# Patient Record
Sex: Male | Born: 1945 | Race: White | Hispanic: No | Marital: Married | Smoking: Current every day smoker
Health system: Southern US, Community
[De-identification: ages and names within clinical notes are randomized; demographics above are authoritative.]

## PROBLEM LIST (undated history)

## (undated) DIAGNOSIS — I10 Essential (primary) hypertension: Secondary | ICD-10-CM

## (undated) DIAGNOSIS — I5020 Unspecified systolic (congestive) heart failure: Secondary | ICD-10-CM

## (undated) DIAGNOSIS — R55 Syncope and collapse: Secondary | ICD-10-CM

## (undated) DIAGNOSIS — I480 Paroxysmal atrial fibrillation: Secondary | ICD-10-CM

## (undated) DIAGNOSIS — E785 Hyperlipidemia, unspecified: Secondary | ICD-10-CM

## (undated) DIAGNOSIS — I214 Non-ST elevation (NSTEMI) myocardial infarction: Secondary | ICD-10-CM

## (undated) DIAGNOSIS — Z720 Tobacco use: Secondary | ICD-10-CM

---

## 2019-07-12 ENCOUNTER — Other Ambulatory Visit: Payer: Self-pay

## 2019-07-12 ENCOUNTER — Encounter: Payer: Self-pay | Admitting: Specialist

## 2019-07-12 ENCOUNTER — Inpatient Hospital Stay
Admission: EM | Admit: 2019-07-12 | Discharge: 2019-07-13 | DRG: 281 | Disposition: A | Discharge status: Home / Self Care | Payer: PPO | Attending: Specialist | Admitting: Specialist

## 2019-07-12 ENCOUNTER — Emergency Department: Payer: PPO

## 2019-07-12 DIAGNOSIS — F0390 Unspecified dementia without behavioral disturbance: Secondary | ICD-10-CM | POA: Diagnosis not present

## 2019-07-12 DIAGNOSIS — Z9119 Patient's noncompliance with other medical treatment and regimen: Secondary | ICD-10-CM

## 2019-07-12 DIAGNOSIS — E860 Dehydration: Secondary | ICD-10-CM | POA: Diagnosis present

## 2019-07-12 DIAGNOSIS — F17210 Nicotine dependence, cigarettes, uncomplicated: Secondary | ICD-10-CM | POA: Diagnosis not present

## 2019-07-12 DIAGNOSIS — I429 Cardiomyopathy, unspecified: Secondary | ICD-10-CM | POA: Diagnosis not present

## 2019-07-12 DIAGNOSIS — I4891 Unspecified atrial fibrillation: Secondary | ICD-10-CM

## 2019-07-12 DIAGNOSIS — R000 Tachycardia, unspecified: Secondary | ICD-10-CM | POA: Diagnosis not present

## 2019-07-12 DIAGNOSIS — I5022 Chronic systolic (congestive) heart failure: Secondary | ICD-10-CM | POA: Diagnosis present

## 2019-07-12 DIAGNOSIS — N179 Acute kidney failure, unspecified: Secondary | ICD-10-CM | POA: Diagnosis not present

## 2019-07-12 DIAGNOSIS — Z809 Family history of malignant neoplasm, unspecified: Secondary | ICD-10-CM | POA: Diagnosis not present

## 2019-07-12 DIAGNOSIS — G9340 Encephalopathy, unspecified: Secondary | ICD-10-CM | POA: Diagnosis present

## 2019-07-12 DIAGNOSIS — I4892 Unspecified atrial flutter: Secondary | ICD-10-CM | POA: Diagnosis not present

## 2019-07-12 DIAGNOSIS — R001 Bradycardia, unspecified: Secondary | ICD-10-CM | POA: Diagnosis not present

## 2019-07-12 DIAGNOSIS — I214 Non-ST elevation (NSTEMI) myocardial infarction: Principal | ICD-10-CM | POA: Diagnosis present

## 2019-07-12 DIAGNOSIS — Z823 Family history of stroke: Secondary | ICD-10-CM

## 2019-07-12 DIAGNOSIS — Z66 Do not resuscitate: Secondary | ICD-10-CM | POA: Diagnosis present

## 2019-07-12 DIAGNOSIS — I480 Paroxysmal atrial fibrillation: Secondary | ICD-10-CM | POA: Diagnosis not present

## 2019-07-12 DIAGNOSIS — I340 Nonrheumatic mitral (valve) insufficiency: Secondary | ICD-10-CM | POA: Diagnosis not present

## 2019-07-12 DIAGNOSIS — E785 Hyperlipidemia, unspecified: Secondary | ICD-10-CM | POA: Diagnosis present

## 2019-07-12 DIAGNOSIS — R404 Transient alteration of awareness: Secondary | ICD-10-CM | POA: Diagnosis not present

## 2019-07-12 DIAGNOSIS — R410 Disorientation, unspecified: Secondary | ICD-10-CM | POA: Diagnosis not present

## 2019-07-12 DIAGNOSIS — R55 Syncope and collapse: Secondary | ICD-10-CM | POA: Diagnosis not present

## 2019-07-12 DIAGNOSIS — Z03818 Encounter for observation for suspected exposure to other biological agents ruled out: Secondary | ICD-10-CM | POA: Diagnosis not present

## 2019-07-12 DIAGNOSIS — I130 Hypertensive heart and chronic kidney disease with heart failure and stage 1 through stage 4 chronic kidney disease, or unspecified chronic kidney disease: Secondary | ICD-10-CM | POA: Diagnosis present

## 2019-07-12 DIAGNOSIS — R4182 Altered mental status, unspecified: Secondary | ICD-10-CM | POA: Diagnosis not present

## 2019-07-12 DIAGNOSIS — I255 Ischemic cardiomyopathy: Secondary | ICD-10-CM | POA: Diagnosis not present

## 2019-07-12 DIAGNOSIS — N189 Chronic kidney disease, unspecified: Secondary | ICD-10-CM | POA: Diagnosis present

## 2019-07-12 DIAGNOSIS — E1122 Type 2 diabetes mellitus with diabetic chronic kidney disease: Secondary | ICD-10-CM | POA: Diagnosis present

## 2019-07-12 DIAGNOSIS — Z20828 Contact with and (suspected) exposure to other viral communicable diseases: Secondary | ICD-10-CM | POA: Diagnosis present

## 2019-07-12 DIAGNOSIS — I361 Nonrheumatic tricuspid (valve) insufficiency: Secondary | ICD-10-CM | POA: Diagnosis not present

## 2019-07-12 HISTORY — DX: Syncope and collapse: R55

## 2019-07-12 HISTORY — DX: Essential (primary) hypertension: I10

## 2019-07-12 HISTORY — DX: Tobacco use: Z72.0

## 2019-07-12 HISTORY — DX: Unspecified systolic (congestive) heart failure: I50.20

## 2019-07-12 HISTORY — DX: Paroxysmal atrial fibrillation: I48.0

## 2019-07-12 HISTORY — DX: Non-ST elevation (NSTEMI) myocardial infarction: I21.4

## 2019-07-12 HISTORY — DX: Hyperlipidemia, unspecified: E78.5

## 2019-07-12 LAB — URINE DRUG SCREEN, QUALITATIVE (ARMC ONLY)
Amphetamines, Ur Screen: NOT DETECTED
Barbiturates, Ur Screen: NOT DETECTED
Benzodiazepine, Ur Scrn: NOT DETECTED
Cannabinoid 50 Ng, Ur ~~LOC~~: NOT DETECTED
Cocaine Metabolite,Ur ~~LOC~~: NOT DETECTED
MDMA (Ecstasy)Ur Screen: NOT DETECTED
Methadone Scn, Ur: NOT DETECTED
Opiate, Ur Screen: NOT DETECTED
Phencyclidine (PCP) Ur S: NOT DETECTED
Tricyclic, Ur Screen: NOT DETECTED

## 2019-07-12 LAB — CBC WITH DIFFERENTIAL/PLATELET
Abs Immature Granulocytes: 0.05 10*3/uL (ref 0.00–0.07)
Basophils Absolute: 0.1 10*3/uL (ref 0.0–0.1)
Basophils Relative: 1 %
Eosinophils Absolute: 0.1 10*3/uL (ref 0.0–0.5)
Eosinophils Relative: 1 %
HCT: 50.7 % (ref 39.0–52.0)
Hemoglobin: 16.5 g/dL (ref 13.0–17.0)
Immature Granulocytes: 1 %
Lymphocytes Relative: 27 %
Lymphs Abs: 2.7 10*3/uL (ref 0.7–4.0)
MCH: 29.3 pg (ref 26.0–34.0)
MCHC: 32.5 g/dL (ref 30.0–36.0)
MCV: 89.9 fL (ref 80.0–100.0)
Monocytes Absolute: 1.1 10*3/uL — ABNORMAL HIGH (ref 0.1–1.0)
Monocytes Relative: 10 %
Neutro Abs: 6.2 10*3/uL (ref 1.7–7.7)
Neutrophils Relative %: 60 %
Platelets: 250 10*3/uL (ref 150–400)
RBC: 5.64 MIL/uL (ref 4.22–5.81)
RDW: 14 % (ref 11.5–15.5)
WBC: 10.2 10*3/uL (ref 4.0–10.5)
nRBC: 0 % (ref 0.0–0.2)

## 2019-07-12 LAB — URINALYSIS, COMPLETE (UACMP) WITH MICROSCOPIC
Bacteria, UA: NONE SEEN
Bilirubin Urine: NEGATIVE
Glucose, UA: NEGATIVE mg/dL
Hgb urine dipstick: NEGATIVE
Ketones, ur: NEGATIVE mg/dL
Leukocytes,Ua: NEGATIVE
Nitrite: NEGATIVE
Protein, ur: NEGATIVE mg/dL
Specific Gravity, Urine: 1.014 (ref 1.005–1.030)
Squamous Epithelial / HPF: NONE SEEN (ref 0–5)
WBC, UA: NONE SEEN WBC/hpf (ref 0–5)
pH: 6 (ref 5.0–8.0)

## 2019-07-12 LAB — COMPREHENSIVE METABOLIC PANEL
ALT: 27 U/L (ref 0–44)
AST: 42 U/L — ABNORMAL HIGH (ref 15–41)
Albumin: 4.3 g/dL (ref 3.5–5.0)
Alkaline Phosphatase: 95 U/L (ref 38–126)
Anion gap: 13 (ref 5–15)
BUN: 16 mg/dL (ref 8–23)
CO2: 23 mmol/L (ref 22–32)
Calcium: 9.1 mg/dL (ref 8.9–10.3)
Chloride: 102 mmol/L (ref 98–111)
Creatinine, Ser: 1.44 mg/dL — ABNORMAL HIGH (ref 0.61–1.24)
GFR calc Af Amer: 55 mL/min — ABNORMAL LOW (ref >60)
GFR calc non Af Amer: 47 mL/min — ABNORMAL LOW (ref >60)
Glucose, Bld: 109 mg/dL — ABNORMAL HIGH (ref 70–99)
Potassium: 3.8 mmol/L (ref 3.5–5.1)
Sodium: 138 mmol/L (ref 135–145)
Total Bilirubin: 1.1 mg/dL (ref 0.3–1.2)
Total Protein: 7.6 g/dL (ref 6.5–8.1)

## 2019-07-12 LAB — HEMOGLOBIN A1C
Hgb A1c MFr Bld: 6.2 % — ABNORMAL HIGH (ref 4.8–5.6)
Mean Plasma Glucose: 131.24 mg/dL

## 2019-07-12 LAB — LIPID PANEL
Cholesterol: 194 mg/dL (ref 0–200)
HDL: 30 mg/dL — ABNORMAL LOW (ref >40)
LDL Cholesterol: 139 mg/dL — ABNORMAL HIGH (ref 0–99)
Total CHOL/HDL Ratio: 6.5 RATIO
Triglycerides: 127 mg/dL (ref <150)
VLDL: 25 mg/dL (ref 0–40)

## 2019-07-12 LAB — MAGNESIUM: Magnesium: 2.3 mg/dL (ref 1.7–2.4)

## 2019-07-12 LAB — PROTIME-INR
INR: 1 (ref 0.8–1.2)
Prothrombin Time: 12.9 seconds (ref 11.4–15.2)

## 2019-07-12 LAB — TROPONIN I (HIGH SENSITIVITY)
Troponin I (High Sensitivity): 4318 ng/L (ref <18)
Troponin I (High Sensitivity): 3479 ng/L (ref <18)

## 2019-07-12 LAB — HEPARIN LEVEL (UNFRACTIONATED): Heparin Unfractionated: 0.37 IU/mL (ref 0.30–0.70)

## 2019-07-12 LAB — APTT: aPTT: 33 seconds (ref 24–36)

## 2019-07-12 LAB — TSH: TSH: 2.338 u[IU]/mL (ref 0.350–4.500)

## 2019-07-12 LAB — ETHANOL: Alcohol, Ethyl (B): <10 mg/dL (ref <10)

## 2019-07-12 MED ORDER — HEPARIN BOLUS VIA INFUSION
4000.0000 [IU] | Freq: Once | INTRAVENOUS | Status: AC
  Administered 2019-07-12: 11:00:00 4000 [IU] via INTRAVENOUS
  Filled 2019-07-12: qty 4000

## 2019-07-12 MED ORDER — ACETAMINOPHEN 650 MG RE SUPP: 650.0000 mg | Freq: Four times a day (QID) | RECTAL | Status: DC | PRN

## 2019-07-12 MED ORDER — ACETAMINOPHEN 325 MG PO TABS: 650.0000 mg | ORAL_TABLET | Freq: Four times a day (QID) | ORAL | Status: DC | PRN

## 2019-07-12 MED ORDER — ASPIRIN EC 81 MG PO TBEC
81.0000 mg | DELAYED_RELEASE_TABLET | Freq: Every day | ORAL | Status: DC
  Administered 2019-07-13: 09:00:00 81 mg via ORAL
  Filled 2019-07-12: qty 1

## 2019-07-12 MED ORDER — HEPARIN (PORCINE) 25000 UT/250ML-% IV SOLN
1150.0000 [IU]/h | INTRAVENOUS | Status: DC
  Administered 2019-07-12 – 2019-07-13 (×2): 1150 [IU]/h via INTRAVENOUS
  Filled 2019-07-12 (×2): qty 250

## 2019-07-12 MED ORDER — ONDANSETRON HCL 4 MG/2ML IJ SOLN: 4.0000 mg | Freq: Four times a day (QID) | INTRAMUSCULAR | Status: DC | PRN

## 2019-07-12 MED ORDER — HALOPERIDOL LACTATE 5 MG/ML IJ SOLN
2.5000 mg | Freq: Four times a day (QID) | INTRAMUSCULAR | Status: DC | PRN
  Administered 2019-07-13: 12:00:00 2.5 mg via INTRAVENOUS
  Filled 2019-07-12: qty 1

## 2019-07-12 MED ORDER — ONDANSETRON HCL 4 MG PO TABS: 4.0000 mg | ORAL_TABLET | Freq: Four times a day (QID) | ORAL | Status: DC | PRN

## 2019-07-12 MED ORDER — METOPROLOL TARTRATE 25 MG PO TABS
12.5000 mg | ORAL_TABLET | Freq: Two times a day (BID) | ORAL | Status: DC
  Administered 2019-07-13: 12.5 mg via ORAL
  Filled 2019-07-12: qty 1

## 2019-07-12 NOTE — H&P (Signed)
[facility] NAME: Danny Gilmore    MR#:  [id number]  DATE OF BIRTH:  1946-09-02  DATE OF ADMISSION:  07/12/2019  PRIMARY CARE PHYSICIAN: Patient, No Pcp Per   REQUESTING/REFERRING PHYSICIAN: Dr. Harvest Dark  CHIEF COMPLAINT:   Chief Complaint  Patient presents with  . Altered Mental Status    HISTORY OF PRESENT ILLNESS:  Danny Gilmore  is a 73 y.o. male with a no known past medical history who presents to the hospital due to altered mental status.  Patient himself cannot provide history therefore most of the history obtained over the phone from his wife and also from the ER physician.  Patient apparently was found wandering in [place] confused and therefore sent to the ER for further evaluation.  Patient cannot recall any of the events and how he got here.  As per the patient's wife they are concerned that patient has had cognitive decline, but he does not see any physicians and has not taken any medications.  Patient presented to the ER underwent a CT of the head which showed no evidence of acute abnormalities but on routine blood work he was noted to have a significantly elevated troponin ruling in for a non-ST elevation MI/and also noted to be in acute kidney injury and new onset atrial fibrillation/flutter.  Hospitalist services were contacted for admission.  As per the patient's wife he has not complained of any chest pain shortness of breath palpitations nausea vomiting or any other associated symptoms.  PAST MEDICAL HISTORY:  No past medical history on file.  PAST SURGICAL HISTORY:   None  SOCIAL HISTORY:   Social History   Tobacco Use  . Smoking status: Current Every Day Smoker    Packs/day: 0.50    Years: 50.00    Pack years: 25.00    Types: Cigarettes  Substance Use Topics  . Alcohol use: Never    Frequency: Never    FAMILY HISTORY:   Family History  Problem Relation Age of Onset  . Stroke Mother   .  Cancer Father     DRUG ALLERGIES:  Not on File  REVIEW OF SYSTEMS:   Review of Systems  Unable to perform ROS: Dementia    MEDICATIONS AT HOME:   Prior to Admission medications   Not on File      VITAL SIGNS:  Blood pressure (!) 144/92, pulse 92, temperature (!) 97.3 F (36.3 C), temperature source Oral, resp. rate 16, height 5\' 10"  (1.778 m), weight 87.5 kg, SpO2 97 %.  PHYSICAL EXAMINATION:  Physical Exam  GENERAL:  73 y.o.-year-old patient lying in the bed in NAD.  EYES: Pupils equal, round, reactive to light and accommodation. No scleral icterus. Extraocular muscles intact.  HEENT: Head atraumatic, normocephalic. Oropharynx and nasopharynx clear. No oropharyngeal erythema, moist oral mucosa  NECK:  Supple, no jugular venous distention. No thyroid enlargement, no tenderness.  LUNGS: Normal breath sounds bilaterally, no wheezing, rales, rhonchi. No use of accessory muscles of respiration.  CARDIOVASCULAR: S1, S2 RRR. No murmurs, rubs, gallops, clicks.  ABDOMEN: Soft, nontender, nondistended. Bowel sounds present. No organomegaly or mass.  EXTREMITIES: No pedal edema, cyanosis, or clubbing. + 2 pedal & radial pulses b/l.   NEUROLOGIC: Cranial nerves II through XII are intact. No focal Motor or sensory deficits appreciated b/l. PSYCHIATRIC: The patient is alert and oriented x 1.  SKIN: No obvious rash, lesion, or ulcer.   LABORATORY PANEL:   CBC Recent  Labs  Lab 07/12/19 0946  WBC 10.2  HGB 16.5  HCT 50.7  PLT 250   ------------------------------------------------------------------------------------------------------------------  Chemistries  Recent Labs  Lab 07/12/19 0946  NA 138  K 3.8  CL 102  CO2 23  GLUCOSE 109*  BUN 16  CREATININE 1.44*  CALCIUM 9.1  MG 2.3  AST 42*  ALT 27  ALKPHOS 95  BILITOT 1.1   ------------------------------------------------------------------------------------------------------------------  Cardiac Enzymes No  results for input(s): TROPONINI in the last 168 hours. ------------------------------------------------------------------------------------------------------------------  RADIOLOGY:  Ct Head Wo Contrast  Result Date: 07/12/2019 CLINICAL DATA:  73 year old with encephalopathy. EXAM: CT HEAD WITHOUT CONTRAST TECHNIQUE: Contiguous axial images were obtained from the base of the skull through the vertex without intravenous contrast. COMPARISON:  MRI report from 02/22/2003.  Images not available. FINDINGS: Brain: No evidence for acute hemorrhage, mass lesion, midline shift, hydrocephalus or large infarct. Low-density in the white matter is suggestive for chronic changes. Vascular: No hyperdense vessel or unexpected calcification. Skull: Normal. Negative for fracture or focal lesion. Sinuses/Orbits: Visualized paranasal sinuses are clear. Other: None IMPRESSION: 1. No acute intracranial abnormality. 2. Low-density in the white matter is suggestive for chronic small vessel ischemic changes. Electronically Signed   By: Richarda OverlieAdam  Henn M.D.   On: 07/12/2019 10:16     IMPRESSION AND PLAN:   73 year old male with no significant past medical history who presents to the hospital due to altered mental status and ruled in for a non-ST elevation MI noted to be in new onset atrial fibrillation/flutter.  1.  Non-ST elevation MI-patient presents to the hospital with altered mental status but incidentally noted to have elevated troponin.  He denies any chest pain shortness of breath.  He is not hypoxic. - Treat the patient medically given patient's altered mental status/dementia.  Placed patient on aspirin, heparin drip, low-dose beta-blocker.  We will also started a statin. -Await echocardiogram results, appreciate cardiology input.  2.  New onset atrial fibrillation/flutter-rate controlled presently. -Continue low-dose metoprolol.  Continue heparin drip for now. -Await echocardiogram results.  Further plan as per  cardiology.  3.  Acute kidney injury-due to dehydration.  - will place on IV fluids and follow BUN/cr.   4. AMS/Encephalopathy -specter secondary to dementia/cognitive decline.  CT head was negative for acute pathology. -Urinalysis is negative, urine drug screen is negative.  We will continue to monitor. -PRN Haldol for agitation.    All the records are reviewed and case discussed with ED provider. Management plans discussed with the patient, family and they are in agreement.  CODE STATUS: DNR  TOTAL TIME TAKING CARE OF THIS PATIENT: 40 minutes.    Houston SirenVivek J  M.D on 07/12/2019 at 1:23 PM  Between 7am to 6pm - Pager - [phone]  After 6pm go to [email or web address] - password [email or web address]  Fabio [facility]  Office  [phone]  CC: Primary care physician; Patient, No Pcp Per

## 2019-07-12 NOTE — ED Notes (Signed)
Safety sitter at bedside 

## 2019-07-12 NOTE — ED Notes (Addendum)
Pt given non slip hospital socks. Pt able to use urinal standing at bedside with assistance. Family member remains at bedside with pt. Pt repositioned in bed by this tech. Sitter still in place.

## 2019-07-12 NOTE — Progress Notes (Signed)
ANTICOAGULATION CONSULT NOTE - Initial Consult  Pharmacy Consult for Herparin drip Indication: ACS/STEMI  Not on File  Patient Measurements: Height: 6\' 2"  (188 cm) Weight: 172 lb 1.6 oz (78.1 kg) IBW/kg (Calculated) : 82.2 Heparin Dosing Weight: 87.5  Vital Signs: Temp: 97.6 F (36.4 C) (08/25 1815) Temp Source: Oral (08/25 1815) BP: 126/82 (08/25 1815) Pulse Rate: 92 (08/25 1815)  Labs: Recent Labs    07/12/19 0946 07/12/19 1034 07/12/19 1145 07/12/19 1843  HGB 16.5  --   --   --   HCT 50.7  --   --   --   PLT 250  --   --   --   APTT  --  33  --   --   LABPROT  --  12.9  --   --   INR  --  1.0  --   --   HEPARINUNFRC  --   --   --  0.37  CREATININE 1.44*  --   --   --   TROPONINIHS 4,318*  --  3,479*  --     Estimated Creatinine Clearance: 49.7 mL/min (A) (by C-G formula based on SCr of 1.44 mg/dL (H)).   Medical History: History reviewed. No pertinent past medical history.  Medications:  Scheduled:  . aspirin EC  81 mg Oral Daily  . metoprolol tartrate  12.5 mg Oral BID   Infusions:  . heparin 1,150 Units/hr (07/12/19 1100)    Assessment: 73 yo M to start Heparin drip for ACS/STEMI, increased troponins. Pt confused. According to son, per ER note, pt does not take any medications PTA Hgb 16.5  Plt 250  aptt 33   INR 1.0  08/25@1843 : HL 0.37  Goal of Therapy:  Heparin level 0.3-0.7 units/ml Monitor platelets by anticoagulation protocol: Yes   Plan:  HL currently therapeutic Will continue infusion at 1150 units/hr Check anti-Xa level in 8 hours and daily while on heparin Continue to monitor H&H and platelets  Hersel Mcmeen A Kaelon Weekes 07/12/2019,7:13 PM

## 2019-07-12 NOTE — ED Notes (Signed)
Patient resting on stretcher. Safety sitter remains. Will continue to monitor.

## 2019-07-12 NOTE — ED Provider Notes (Signed)
[facility] Emergency Department Provider Note  Time seen: 9:44 AM  I have reviewed the triage vital signs and the nursing notes.   HISTORY  Chief Complaint Altered Mental Status    HPI Danny Gilmore is a 73 y.o. male with no known past medical history who presents to the emergency department for altered mental status.  According to EMS patient was at [facility] shopping reportedly by himself when he is found to be very confused.  EMS states initially he was somewhat combative as well.  Currently patient is oriented to person only not to place or time or situation.  Patient will attempt to answer some questions with yes or no answers but appears to be an accurate.  He is calm at this time, but does appear confused.  We have no history on the patient currently, or attempting to obtain any phone numbers, etc. for family members may help with history.  Patient unable to provide any meaningful history or review of systems.   No past medical history on file.  There are no active problems to display for this patient.    Prior to Admission medications   Not on File    Not on File  No family history on file.  Social History Social History   Tobacco Use  . Smoking status: Not on file  Substance Use Topics  . Alcohol use: Not on file  . Drug use: Not on file    Review of Systems Unable to obtain adequate/accurate review of systems secondary to altered mental status/confusion.  ____________________________________________   PHYSICAL EXAM:  VITAL SIGNS: ED Triage Vitals  Enc Vitals Group     BP 07/12/19 0938 (!) 147/87     Pulse Rate 07/12/19 0938 (!) 101     Resp 07/12/19 0938 20     Temp 07/12/19 0938 (!) 97.3 F (36.3 C)     Temp Source 07/12/19 0938 Oral     SpO2 07/12/19 0938 97 %     Weight 07/12/19 0939 193 lb (87.5 kg)     Height --      Head Circumference --      Peak Flow --      Pain Score --      Pain Loc --      Pain Edu? --    Excl. in [place]? --    Constitutional: Patient is awake and alert.  He is oriented to person only. Eyes: Normal exam ENT      Head: Normocephalic and atraumatic.      Mouth/Throat: Mucous membranes are moist. Cardiovascular: Normal rate, regular rhythm.  Respiratory: Normal respiratory effort without tachypnea nor retractions. Breath sounds are clear  Gastrointestinal: Soft and nontender. No distention.   Musculoskeletal: Nontender with normal range of motion in all extremities.  Neurologic:  Normal speech and language. No gross focal neurologic deficits Skin:  Skin is warm, dry and intact.  Psychiatric: Patient appears to be confused.  ____________________________________________    EKG  EKG viewed and interpreted by myself.  Shows atrial fibrillation at 99 bpm with a narrow QRS, left axis deviation, largely normal intervals with nonspecific ST changes  ____________________________________________    RADIOLOGY  CT head negative for acute abnormality  ____________________________________________   INITIAL IMPRESSION / ASSESSMENT AND PLAN / ED COURSE  Pertinent labs & imaging results that were available during my care of the patient were reviewed by me and considered in my medical decision making (see chart for details).  [facility]  Patient appeared to have driven himself per EMS, has keys in his pocket.  Is acutely confused.  [facility] to provide a [facility].  No reported fall or seizure.  Differential is quite broad but would include CVA, ICH, seizure, metabolic or electrolyte abnormality, infectious etiology, substance use.  We will check labs, CT scan of the head and continue to closely monitor.  We will continue to attempt to find contact information for family for more information/history.  I was able to reach the patient's son.  Patient's son states the patient  does not see any doctors, to his knowledge does not take any medications.  He states the patient lives with his wife, who he will contact and inform her to come to the ER.  He does state that the patient can be stubborn and will not answer questions often times unless he really pressed him to answer questions, but it still seems as if the patient is more confused currently than what the son is describing.  Awaiting wife arrival for further history.  Patient's troponin has resulted at 4300.  Likely indicating NSTEMI.  CT scan of the head appears negative for acute abnormality.  Remainder the lab work is thus far reassuring.  We will start the patient on a heparin infusion and discussed with cardiology.  Danny Gilmore was evaluated in Emergency Department on 07/12/2019 for the symptoms described in the history of present illness. He was evaluated in the context of the global COVID-19 pandemic, which necessitated consideration that the patient might be at risk for infection with the SARS-CoV-2 virus that causes COVID-19. Institutional protocols and algorithms that pertain to the evaluation of patients at risk for COVID-19 are in a state of rapid change based on information released by regulatory bodies including the CDC and federal and state organizations. These policies and algorithms were followed during the patient's care in the ED.  CRITICAL CARE Performed by: Minna AntisKevin Rustin Erhart   Total critical care time: 30 minutes  Critical care time was exclusive of separately billable procedures and treating other patients.  Critical care was necessary to treat or prevent imminent or life-threatening deterioration.  Critical care was time spent personally by me on the following activities: development of treatment plan with patient and/or surrogate as well as nursing, discussions with consultants, evaluation of patient's response to treatment, examination of patient, obtaining history from patient or surrogate,  ordering and performing treatments and interventions, ordering and review of laboratory studies, ordering and review of radiographic studies, pulse oximetry and re-evaluation of patient's condition.   ____________________________________________   FINAL CLINICAL IMPRESSION(S) / ED DIAGNOSES  Altered mental status NSTEMI   Minna AntisPaduchowski, Jorma Tassinari, MD 07/12/19 1026

## 2019-07-12 NOTE — ED Notes (Signed)
Dr End in with pt.  Sitter at bedside.

## 2019-07-12 NOTE — Progress Notes (Addendum)
ANTICOAGULATION CONSULT NOTE - Initial Consult  Pharmacy Consult for Herparin drip Indication: ACS/STEMI  Not on File  Patient Measurements: Height: 5\' 10"  (177.8 cm) Weight: 193 lb (87.5 kg) IBW/kg (Calculated) : 73 Heparin Dosing Weight: 87.5  Vital Signs: Temp: 97.3 F (36.3 C) (08/25 0938) Temp Source: Oral (08/25 0938) BP: 146/85 (08/25 1034) Pulse Rate: 95 (08/25 1034)  Labs: Recent Labs    07/12/19 0946  HGB 16.5  HCT 50.7  PLT 250  CREATININE 1.44*  TROPONINIHS 4,318*    Estimated Creatinine Clearance: 46.5 mL/min (A) (by C-G formula based on SCr of 1.44 mg/dL (H)).   Medical History: No past medical history on file.  Medications:  Scheduled:  . heparin  4,000 Units Intravenous Once   Infusions:  . heparin      Assessment: 73 yo M to start Heparin drip for ACS/STEMI, increased troponins. Pt confused. According to son, per ER note, pt does not take any medications PTA Hgb 16.5  Plt 250  aptt 33   INR 1.0  Goal of Therapy:  Heparin level 0.3-0.7 units/ml Monitor platelets by anticoagulation protocol: Yes   Plan:  Give 4000 units bolus x 1 Start heparin infusion at 1150 units/hr Check anti-Xa level in 8 hours and daily while on heparin Continue to monitor H&H and platelets  Garner Dullea A 07/12/2019,10:42 AM

## 2019-07-12 NOTE — ED Notes (Signed)
ED TO INPATIENT HANDOFF REPORT  ED Nurse Name and Phone #: Sekai Nayak  S Name/Age/Gender Rico Junker 73 y.o. male Room/Bed: ED24A/ED24A  Code Status   Code Status: Not on file  Home/SNF/Other Home Patient oriented to: self Is this baseline? No   Triage Complete: Triage complete  Chief Complaint syncope ems  Triage Note Patient presents to ED via [facility] from [facility] after being found by a bystander. Patient was found lying on the ground. Patient is A&O x1 on arrival, only oriented to self. Patient will not follow commands. EMS report patient was combative during transport.    Allergies Not on File  Level of Care/Admitting Diagnosis ED Disposition    ED Disposition Condition [facility] Area: [place] [[id number]]  Level of Care: Telemetry [5]  Covid Evaluation: Asymptomatic Screening Protocol (No Symptoms)  Diagnosis: NSTEMI (non-ST elevated myocardial infarction) [facility]) [[id number]]  Admitting Physician: Henreitta Leber [[id number]]  Attending Physician: Henreitta Leber [[id number]]  Estimated length of stay: past midnight tomorrow  Certification:: I certify this patient will need inpatient services for at least 2 midnights  PT Class (Do Not Modify): Inpatient [101]  PT Acc Code (Do Not Modify): Private [1]       B Medical/Surgery History History reviewed. No pertinent past medical history. History reviewed. No pertinent surgical history.   A IV Location/Drains/Wounds Patient Lines/Drains/Airways Status   Active Line/Drains/Airways    Name:   Placement date:   Placement time:   Site:   Days:   Peripheral IV 07/12/19 Left Antecubital   07/12/19    0945    Antecubital   less than 1   Peripheral IV 07/12/19 Left Forearm   07/12/19    1040    Forearm   less than 1          Intake/Output Last 24 hours  Intake/Output Summary (Last 24 hours) at 07/12/2019 1741 Last data filed at 07/12/2019 1605 Gross per 24 hour  Intake -  Output 100 ml   Net -100 ml    Labs/Imaging Results for orders placed or performed during the hospital encounter of 07/12/19 (from the past 48 hour(s))  CBC with Differential     Status: Abnormal   Collection Time: 07/12/19  9:46 AM  Result Value Ref Range   WBC 10.2 4.0 - 10.5 K/uL   RBC 5.64 4.22 - 5.81 MIL/uL   Hemoglobin 16.5 13.0 - 17.0 g/dL   HCT 50.7 39.0 - 52.0 %   MCV 89.9 80.0 - 100.0 fL   MCH 29.3 26.0 - 34.0 pg   MCHC 32.5 30.0 - 36.0 g/dL   RDW 14.0 11.5 - 15.5 %   Platelets 250 150 - 400 K/uL   nRBC 0.0 0.0 - 0.2 %   Neutrophils Relative % 60 %   Neutro Abs 6.2 1.7 - 7.7 K/uL   Lymphocytes Relative 27 %   Lymphs Abs 2.7 0.7 - 4.0 K/uL   Monocytes Relative 10 %   Monocytes Absolute 1.1 (H) 0.1 - 1.0 K/uL   Eosinophils Relative 1 %   Eosinophils Absolute 0.1 0.0 - 0.5 K/uL   Basophils Relative 1 %   Basophils Absolute 0.1 0.0 - 0.1 K/uL   Immature Granulocytes 1 %   Abs Immature Granulocytes 0.05 0.00 - 0.07 K/uL    Comment: Performed at [facility], [address] [place], [place] [address]  Comprehensive metabolic panel     Status: Abnormal   Collection Time:  07/12/19  9:46 AM  Result Value Ref Range   Sodium 138 135 - 145 mmol/L   Potassium 3.8 3.5 - 5.1 mmol/L   Chloride 102 98 - 111 mmol/L   CO2 23 22 - 32 mmol/L   Glucose, Bld 109 (H) 70 - 99 mg/dL   BUN 16 8 - 23 mg/dL   Creatinine, Ser 4.091.44 (H) 0.61 - 1.24 mg/dL   Calcium 9.1 8.9 - 81.110.3 mg/dL   Total Protein 7.6 6.5 - 8.1 g/dL   Albumin 4.3 3.5 - 5.0 g/dL   AST 42 (H) 15 - 41 U/L   ALT 27 0 - 44 U/L   Alkaline Phosphatase 95 38 - 126 U/L   Total Bilirubin 1.1 0.3 - 1.2 mg/dL   GFR calc non Af Amer 47 (L) >60 mL/min   GFR calc Af Amer 55 (L) >60 mL/min   Anion gap 13 5 - 15    Comment: Performed at [facility], [address] [place], [place] [address]  Troponin I (High Sensitivity)     Status: Abnormal   Collection Time: 07/12/19  9:46 AM  Result Value Ref Range   Troponin I (High  Sensitivity) 4,318 (HH) <18 ng/L    Comment: CRITICAL RESULT CALLED TO, READ BACK BY AND VERIFIED WITH  [place]az[place]JORDAN MOORE AT 1021 07/12/2019 SDR (NOTE) Elevated high sensitivity troponin I (hsTnI) values and significant  changes across serial measurements may suggest ACS but many other  chronic and acute conditions are known to elevate hsTnI results.  Refer to the "Links" section for chest pain algorithms and additional  guidance. Performed at [facility], [address] [place], [place] [address]   Ethanol     Status: None   Collection Time: 07/12/19  9:46 AM  Result Value Ref Range   Alcohol, Ethyl (B) <10 <10 mg/dL    Comment: (NOTE) Lowest detectable limit for serum alcohol is 10 mg/dL. For medical purposes only. Performed at [facility], [address] [place], [place] [address]   Lipid panel     Status: Abnormal   Collection Time: 07/12/19  9:46 AM  Result Value Ref Range   Cholesterol 194 0 - 200 mg/dL   Triglycerides 578127 <469<150 mg/dL   HDL 30 (L) >62>40 mg/dL   Total CHOL/HDL Ratio 6.5 RATIO   VLDL 25 0 - 40 mg/dL   LDL Cholesterol 952139 (H) 0 - 99 mg/dL    Comment:        Total Cholesterol/HDL:CHD Risk Coronary Heart Disease Risk Table                     Men   Women  1/2 Average Risk   3.4   3.3  Average Risk       5.0   4.4  2 X Average Risk   9.6   7.1  3 X Average Risk  23.4   11.0        Use the calculated Patient Ratio above and the CHD Risk Table to determine the patient's CHD Risk.        ATP III CLASSIFICATION (LDL):  <100     mg/dL   Optimal  [id number]  mg/dL   Near or Above                    Optimal  130-159  mg/dL   Borderline  [phone]  mg/dL   High  >253>190     mg/dL  Very High Performed at [facility], [address] [place], [place] [address]   Magnesium     Status: None   Collection Time: 07/12/19  9:46 AM  Result Value Ref Range   Magnesium 2.3 1.7 - 2.4 mg/dL    Comment: Performed at [facility], [address] [place], [place] [address]  Protime-INR     Status: None   Collection Time: 07/12/19 10:34 AM  Result Value Ref Range   Prothrombin Time 12.9 11.4 - 15.2 seconds   INR 1.0 0.8 - 1.2    Comment: (NOTE) INR goal varies based on device and disease states. Performed at [facility], [address] [place], [place] [address]   APTT     Status: None   Collection Time: 07/12/19 10:34 AM  Result Value Ref Range   aPTT 33 24 - 36 seconds    Comment: Performed at [facility], [address] [place], [place] [address]  Troponin I (High Sensitivity)     Status: Abnormal   Collection Time: 07/12/19 11:45 AM  Result Value Ref Range   Troponin I (High Sensitivity) 3,479 (HH) <18 ng/L    Comment: CRITICAL RESULT CONSISTENT WITH PREVIOUSLY CALLED CRITICAL VALUE SDR (NOTE) Elevated high sensitivity troponin I (hsTnI) values and significant  changes across serial measurements may suggest ACS but many other  chronic and acute conditions are known to elevate hsTnI results.  Refer to the Links section for chest pain algorithms and additional  guidance. Performed at [facility], [address] [place], [place] [address] CORRECTED ON 08/25 AT 1248: PREVIOUSLY REPORTED AS 3479 CRTPRC SDR   TSH     Status: None   Collection Time: 07/12/19 11:45 AM  Result Value Ref Range   TSH 2.338 0.350 - 4.500 uIU/mL    Comment: Performed by a 3rd Generation assay with a functional sensitivity of <=0.01 uIU/mL. Performed at [facility], [address] [place], [place] [address]   Hemoglobin A1c     Status: Abnormal   Collection Time: 07/12/19 11:45 AM  Result Value Ref Range   Hgb A1c MFr Bld 6.2 (H) 4.8 - 5.6 %    Comment: (NOTE) Pre diabetes:          5.7%-6.4% Diabetes:              >6.4% Glycemic control for   <7.0% adults with diabetes    Mean Plasma Glucose 131.24 mg/dL    Comment: Performed at [facility], [address],  [place], [place] [address]  Urinalysis, Complete w Microscopic     Status: Abnormal   Collection Time: 07/12/19 12:04 PM  Result Value Ref Range   Color, Urine YELLOW (A) YELLOW   APPearance CLEAR (A) CLEAR   Specific Gravity, Urine 1.014 1.005 - 1.030   pH 6.0 5.0 - 8.0   Glucose, UA NEGATIVE NEGATIVE mg/dL   Hgb urine dipstick NEGATIVE NEGATIVE   Bilirubin Urine NEGATIVE NEGATIVE   Ketones, ur NEGATIVE NEGATIVE mg/dL   Protein, ur NEGATIVE NEGATIVE mg/dL   Nitrite NEGATIVE NEGATIVE   Leukocytes,Ua NEGATIVE NEGATIVE   WBC, UA NONE SEEN 0 - 5 WBC/hpf   Bacteria, UA NONE SEEN NONE SEEN   Squamous Epithelial / LPF NONE SEEN 0 - 5   Mucus PRESENT    Hyaline Casts, UA PRESENT     Comment: Performed at [facility], [address], [place], [place] [address]  Urine Drug Screen, Qualitative (  [facility] only)     Status: None   Collection Time: 07/12/19 12:04 PM  Result Value Ref Range   Tricyclic, Ur Screen NONE DETECTED NONE DETECTED   Amphetamines, Ur Screen NONE DETECTED NONE DETECTED   MDMA (Ecstasy)Ur Screen NONE DETECTED NONE DETECTED   Cocaine Metabolite,Ur [place] NONE DETECTED NONE DETECTED   Opiate, Ur Screen NONE DETECTED NONE DETECTED   Phencyclidine (PCP) Ur S NONE DETECTED NONE DETECTED   Cannabinoid 50 Ng, Ur [place] NONE DETECTED NONE DETECTED   Barbiturates, Ur Screen NONE DETECTED NONE DETECTED   Benzodiazepine, Ur Scrn NONE DETECTED NONE DETECTED   Methadone Scn, Ur NONE DETECTED NONE DETECTED    Comment: (NOTE) Tricyclics + metabolites, urine    Cutoff 1000 ng/mL Amphetamines + metabolites, urine  Cutoff 1000 ng/mL MDMA (Ecstasy), urine              Cutoff 500 ng/mL Cocaine Metabolite, urine          Cutoff 300 ng/mL Opiate + metabolites, urine        Cutoff 300 ng/mL Phencyclidine (PCP), urine         Cutoff 25 ng/mL Cannabinoid, urine                 Cutoff 50 ng/mL Barbiturates + metabolites, urine  Cutoff 200 ng/mL Benzodiazepine, urine              Cutoff 200  ng/mL Methadone, urine                   Cutoff 300 ng/mL The urine drug screen provides only a preliminary, unconfirmed analytical test result and should not be used for non-medical purposes. Clinical consideration and professional judgment should be applied to any positive drug screen result due to possible interfering substances. A more specific alternate chemical method must be used in order to obtain a confirmed analytical result. Gas chromatography / mass spectrometry (GC/MS) is the preferred confirmat ory method. Performed at [facility], [address] [place], [place] [address]    Ct Head Wo Contrast  Result Date: 07/12/2019 CLINICAL DATA:  73 year old with encephalopathy. EXAM: CT HEAD WITHOUT CONTRAST TECHNIQUE: Contiguous axial images were obtained from the base of the skull through the vertex without intravenous contrast. COMPARISON:  MRI report from 02/22/2003.  Images not available. FINDINGS: Brain: No evidence for acute hemorrhage, mass lesion, midline shift, hydrocephalus or large infarct. Low-density in the white matter is suggestive for chronic changes. Vascular: No hyperdense vessel or unexpected calcification. Skull: Normal. Negative for fracture or focal lesion. Sinuses/Orbits: Visualized paranasal sinuses are clear. Other: None IMPRESSION: 1. No acute intracranial abnormality. 2. Low-density in the white matter is suggestive for chronic small vessel ischemic changes. Electronically Signed   By: Richarda OverlieAdam  Henn M.D.   On: 07/12/2019 10:16    Pending Labs Unresulted Labs (From admission, onward)    Start     Ordered   07/12/19 1900  Heparin level (unfractionated)  Once-Timed,   STAT     07/12/19 1209   07/12/19 1036  Novel Coronavirus, NAA (Hosp order, Send-out to [facility]; TAT 18-24 hrs  (Symptomatic/High Risk of Exposure/Tier 1 Patients Labs with Precautions)  Once,   STAT    Question Answer Comment  Is this test for diagnosis or screening Diagnosis of ill  patient   Symptomatic for COVID-19 as defined by CDC No   Hospitalized for COVID-19 No   Admitted to ICU for COVID-19 No   Previously tested for COVID-19  No   Resident in a congregate (group) care setting No   Employed in healthcare setting No      07/12/19 1035   Signed and Held  Basic metabolic panel  Tomorrow morning,   R     Signed and Held   Signed and Held  CBC  Tomorrow morning,   R     Signed and Held          Vitals/Pain Today's Vitals   07/12/19 1515 07/12/19 1517 07/12/19 1530 07/12/19 1545  BP:   (!) 141/99   Pulse:  90 86 (!) 102  Resp: (!) 22 16 14 20   Temp:      TempSrc:      SpO2:  97% 97% 95%  Weight:      Height:        Isolation Precautions No active isolations  Medications Medications  heparin ADULT infusion 100 units/mL (25000 units/28350mL sodium chloride 0.45%) (1,150 Units/hr Intravenous New Bag/Given 07/12/19 1100)  heparin bolus via infusion 4,000 Units (4,000 Units Intravenous Bolus from Bag 07/12/19 1059)    Mobility walks High fall risk   Focused Assessments Cardiac Assessment Handoff:  Cardiac Rhythm: (S) Atrial fibrillation No results found for: CKTOTAL, CKMB, CKMBINDEX, TROPONINI No results found for: DDIMER Does the Patient currently have chest pain? No      R Recommendations: See Admitting Provider Note  Report given to:   Additional Notes: none

## 2019-07-12 NOTE — ED Notes (Signed)
Pt continues to pull at IV, remove EKG leads, pulse ox and BP cuff. This tech remains sitting at pt bedside. Pt is currently calm and watching tv with family member at bedside.

## 2019-07-12 NOTE — ED Notes (Signed)
Report called to serenity rn floor nurse  

## 2019-07-12 NOTE — ED Triage Notes (Signed)
Patient presents to ED via [facility] from [facility] after being found by a bystander. Patient was found lying on the ground. Patient is A&O x1 on arrival, only oriented to self. Patient will not follow commands. EMS report patient was combative during transport.

## 2019-07-12 NOTE — Consult Note (Signed)
Cardiology Consultation:   Patient ID: Danny Gilmore MRN: [id number]; DOB: 1946/03/17  Admit date: 07/12/2019 Date of Consult: 07/12/2019  Primary Care Provider: Patient, No Pcp Per Primary Cardiologist: [facility], Dr. Okey DupreEnd Primary Electrophysiologist:  None    Patient Profile:   Danny Gilmore is a 73 y.o. male with a hx of newly diagnosed atrial fibrillation with RVR, current tobacco use (10 cigarettes / day), and reportedly "sleep issues" who is being seen today for the evaluation of NSTEMI at the request of Dr. Cherlynn KaiserSainani.  History of Present Illness:   Danny Gilmore is a 73 yo male with unknown past medical history. The patient reported a current history of cigarette smoking at ~ 10 cigarettes / day but denied current drug and alcohol use. Per patient, he does not take any medications.   Of note, due to limited history obtained via EMR and the patient himself, the patient's wife was called during time of cardiology consultation. Unfortunately, she was not able to provide any other additional information other than that the patient was at [facility] for "sleep issues" ~15 years ago. The patient lives at home with his wife. She stated the patient "does not like to complain" and "never sees a doctor. Leading up to today's admission, she noted that the patient was coughing. Otherwise, she reported he had no complaints. No c/o chest pain, pre-syncope, shortness of breath, rapid HR, palpitations, DOE, nausea, emesis, or diaphoresis. No reported LEE, abdominal distention, or orthopnea.  On 07/12/2019, the patient [facility] via EMS and after a syncopal episode with LOC that took place while he was shopping at [employer]. The patient stated that he does not remember anything leading up to the syncopal event. Both before and after the event, he denied feeling CP, palpitations, rapid HR, SOB, or pre-syncope symptoms. The patient's wife was not with him at the time of the event, as she stated [place] had gone [place]  [employer] alone.   In the ED, the patient was reportedly combative and confused, only oriented to person. On exam in the ED, the patient was well oriented but easily frustrated with history and ROS questions. Answers to most questions were brief in nature. He confirmed that he did not see doctors and did not feel that he had any medical issues. He was reassured when hearing that his wife was on her way to the hospital. He denied any current CP, racing HR, or palpitations. No feelings of pre-syncope. He did note that he felt SOB/DOE when wearing the mask, and only felt a little better when pulling on the bottom of the mask to let in additional air. Telemetry significant for Afib with RVR with patient denying any PMH of arrhythmia. Vitals significant for BP 147/87, HR 101, T 97.3, 97% ORA. Labs significant for HS Tn 4,318 and cycling. Cr 1.44, BUN 16, AST 42, ALT 27, Hgb 16.5. EKG coarse atrial fibrillation with ventricular rate 99 bpm, PVCs, Q waves noted in inferior leads/evidence of inferior posterior infarct, Q waves in V5 to V6 or lateral precordial leads, IVCD.  CT head negative for acute abnormality.   Heart Pathway Score:     No past medical history on file.  Patient denied past medical history of heart disease or arrhythmia.  Patient denied any home medications.  Home Medications:  Prior to Admission medications   Not on File    Inpatient Medications: Scheduled Meds:  Continuous Infusions:  heparin 1,150 Units/hr (07/12/19 1100)   PRN Meds:  Allergies:   Not on File  Social History:   Social History   Socioeconomic History   Marital status: Married    Spouse name: Not on file   Number of children: Not on file   Years of education: Not on file   Highest education level: Not on file  Occupational History   Not on file  Social Needs   Financial resource strain: Not on file   Food insecurity    Worry: Not on file    Inability: Not on file   Transportation needs     Medical: Not on file    Non-medical: Not on file  Tobacco Use   Smoking status: Not on file  Substance and Sexual Activity   Alcohol use: Not on file   Drug use: Not on file   Sexual activity: Not on file  Lifestyle   Physical activity    Days per week: Not on file    Minutes per session: Not on file   Stress: Not on file  Relationships   Social connections    Talks on phone: Not on file    Gets together: Not on file    Attends religious service: Not on file    Active member of club or organization: Not on file    Attends meetings of clubs or organizations: Not on file    Relationship status: Not on file   Intimate partner violence    Fear of current or ex partner: Not on file    Emotionally abused: Not on file    Physically abused: Not on file    Forced sexual activity: Not on file  Other Topics Concern   Not on file  Social History Narrative   Not on file    Family History:   Patient denied family history of cardiac disease.  ROS:  Please see the history of present illness.  Review of Systems  Constitutional: Negative for chills, fever and malaise/fatigue.  Respiratory: Positive for cough and shortness of breath. Negative for hemoptysis and sputum production.   Cardiovascular: Negative for chest pain, palpitations and leg swelling.  Gastrointestinal: Negative for blood in stool, melena, nausea and vomiting.  Genitourinary: Negative for hematuria.  Musculoskeletal: Positive for falls.  Neurological: Positive for loss of consciousness. Negative for dizziness and focal weakness.  All other systems reviewed and are negative.   All other ROS reviewed and negative.     Physical Exam/Data:   Vitals:   07/12/19 0939 07/12/19 1034 07/12/19 1035 07/12/19 1100  BP:  (!) 146/85  130/80  Pulse:  95    Resp:  20    Temp:      TempSrc:      SpO2:  97%    Weight: 87.5 kg     Height: 5\' 10"  (1.778 m)  5\' 10"  (1.778 m)    No intake or output data in the 24  hours ending 07/12/19 1110 Last 3 Weights 07/12/2019  Weight (lbs) 193 lb  Weight (kg) 87.544 kg     Body mass index is 27.69 kg/m.  General:  Well nourished, well developed, in no acute distress. Frustrated. HEENT: normal Neck: no JVD Vascular: No carotid bruits; radial pulses 2+ bilaterally Cardiac:  normal S1, S2; IRIR; 1/6 systolic murmur Lungs:  clear to auscultation bilaterally, reduced base breath sounds at right base, coarse L breath sounds at L base. no wheezing, rhonchi or rales  Abd: soft, nontender, no hepatomegaly  Ext: no b/l LE edema Musculoskeletal:  No  deformities, BUE and BLE strength normal and equal Skin: warm and dry  Neuro:  No focal abnormalities noted Psych:  Frustrated   EKG:  The EKG was personally reviewed and demonstrates:  EKG coarse atrial fibrillation with ventricular rate 99 bpm, PVCs, Q waves noted in inferior leads/evidence of inferior posterior infarct, Q waves in V5 to V6 or lateral precordial leads, IVCD Telemetry:  Telemetry was personally reviewed and demonstrates:  IRIR  Relevant CV Studies: No previous studies  Laboratory Data:  High Sensitivity Troponin:   Recent Labs  Lab 07/12/19 0946  TROPONINIHS 4,318*     Cardiac EnzymesNo results for input(s): TROPONINI in the last 168 hours. No results for input(s): TROPIPOC in the last 168 hours.  Chemistry Recent Labs  Lab 07/12/19 0946  NA 138  K 3.8  CL 102  CO2 23  GLUCOSE 109*  BUN 16  CREATININE 1.44*  CALCIUM 9.1  GFRNONAA 47*  GFRAA 55*  ANIONGAP 13    Recent Labs  Lab 07/12/19 0946  PROT 7.6  ALBUMIN 4.3  AST 42*  ALT 27  ALKPHOS 95  BILITOT 1.1   Hematology Recent Labs  Lab 07/12/19 0946  WBC 10.2  RBC 5.64  HGB 16.5  HCT 50.7  MCV 89.9  MCH 29.3  MCHC 32.5  RDW 14.0  PLT 250   BNPNo results for input(s): BNP, PROBNP in the last 168 hours.  DDimer No results for input(s): DDIMER in the last 168 hours.   Radiology/Studies:  Ct Head Wo  Contrast  Result Date: 07/12/2019 CLINICAL DATA:  73 year old with encephalopathy. EXAM: CT HEAD WITHOUT CONTRAST TECHNIQUE: Contiguous axial images were obtained from the base of the skull through the vertex without intravenous contrast. COMPARISON:  MRI report from 02/22/2003.  Images not available. FINDINGS: Brain: No evidence for acute hemorrhage, mass lesion, midline shift, hydrocephalus or large infarct. Low-density in the white matter is suggestive for chronic changes. Vascular: No hyperdense vessel or unexpected calcification. Skull: Normal. Negative for fracture or focal lesion. Sinuses/Orbits: Visualized paranasal sinuses are clear. Other: None IMPRESSION: 1. No acute intracranial abnormality. 2. Low-density in the white matter is suggestive for chronic small vessel ischemic changes. Electronically Signed   By: Richarda OverlieAdam  Henn M.D.   On: 07/12/2019 10:16    Assessment and Plan:   NSTEMI -Patient denies any chest pain.  Does report shortness of breath, only slightly improved by moving his mask.  S/p earlier syncopal episode as above in HPI. --EKG shows Q waves noted in inferior leads/evidence of inferior posterior infarct, Q waves in V5 to V6 or lateral precordial leads, IVCD --High-sensitivity troponin elevated 4318.  Continue to cycle until peaked and downtrending. --Risk factors for cardiac etiology include current tobacco use.  Ordered lipid panel and hemoglobin A1c for further risk stratification. --Recommend further ischemic work-up at this time with cardiac catheterization, if patient agreeable to this procedure.  Given the patient's mental status/frustration at this time, will defer cardiac catheterization to 8/26 or until able to obtain consent. --Continue heparin with CBC.  If catheterization tomorrow, n.p.o. after midnight.  Continue to cycle troponin until peaked/ downtrending.  Daily BMET. Recommend ASA and beta-blocker (room in both HR, BP). Before discharge, recommend statin therapy  (lipid panel pending) and ACE as renal function allows.  --Further recommendations pending catheterization (if consented) and echo.   PAF, newly diagnosed --New diagnosis.  Patient denies a past history of arrhythmia or paroxysmal atrial fibrillation. --CHA2DS2VASc score of at least  1 (likely greater, with  further work-up and risk stratification pending). Will update score as needed. For now, continue ASA, heparin. --Rate control recommended with BB as tolerated. Checking TSH. --Consider Zio at discharge to assess Afib /ectopy burden and monitor for other arrhythmia at discharge (if not seen on telemetry). --Recommend echo as above to assess valves/structure and given no previous echo on file.  Syncope --Etiology of syncope unclear but suspect cardiac in nature given elevated Tn as above, as well as Atrial fibrillation, noted at admission and which appears a new diagnosis. --Recommend Zio (live d/t syncope) at discharge as above to ensure patient does not have additional arrhthymias not caught on telemetry and assess burden of Afib. --Recommend echo this admission to assess for any valvular disease or acute structural changes.  --Further recommendations following cath.  Elevated BP --Unknown if diagnosis of HTN leading up to admission. Recommend BB and ACE before discharge if renal function allows. Titrate as HR allows for optimal BP control.  CKD --Daily BMET. Current Cr 1.44, and unknown if elevated from baseline.  Current tobacco use  --Cessation advised   For questions or updates, please contact [place] Please consult [email or web address] for contact info under     Signed, Arvil Chaco, PA-C  07/12/2019 11:10 AM

## 2019-07-12 NOTE — Progress Notes (Signed)
   [place] at [place] [facility] Day: 0 days Danny Gilmore is a 73 y.o. male with no significant past medical history who presented to the hospital due to altered mental status/confusion and ruled in for a non-ST elevation MI with new onset atrial fibrillation.  Discussed CODE STATUS with patient's wife over the phone.  Explained to her difference between DNR and full code.  Patient's wife claimed that her husband would not want any aggressive measures to save his life.  Patient will be a DNR.  Advance care planning discussed with patient  without additional Family at bedside. All questions in regards to overall condition and expected prognosis answered. The decision was made to continue current code status  CODE STATUS: dnr Time spent: 16 minutes

## 2019-07-12 NOTE — ED Notes (Signed)
Resumed care from [place] rn.  Pt alert. Pt waiting on admission.  Family at bedside.  Safety sitter with pt now.  Pt calm and cooperative at this time.  Iv meds infusing.

## 2019-07-12 NOTE — ED Notes (Signed)
Patient transported to CT 

## 2019-07-12 NOTE — ED Notes (Signed)
Date and time results received: 07/12/19 1024 (use smartphrase ".now" to insert current time)  Test: troponin Critical Value: [place]  Name of Provider Notified: K. Paduchowski

## 2019-07-12 NOTE — ED Notes (Addendum)
Pt was able to use bed pan with no complications, sitter received help from [place], pt is in gown comfortable in bed with family at bedside and sitter still in place

## 2019-07-13 ENCOUNTER — Encounter: Payer: Self-pay | Admitting: Physician Assistant

## 2019-07-13 ENCOUNTER — Telehealth: Payer: Self-pay | Admitting: Physician Assistant

## 2019-07-13 ENCOUNTER — Inpatient Hospital Stay (HOSPITAL_COMMUNITY)
Admit: 2019-07-13 | Discharge: 2019-07-13 | Disposition: A | Discharge status: Home / Self Care | Payer: PPO | Attending: Specialist | Admitting: Specialist

## 2019-07-13 DIAGNOSIS — R410 Disorientation, unspecified: Secondary | ICD-10-CM

## 2019-07-13 DIAGNOSIS — I340 Nonrheumatic mitral (valve) insufficiency: Secondary | ICD-10-CM

## 2019-07-13 DIAGNOSIS — I214 Non-ST elevation (NSTEMI) myocardial infarction: Principal | ICD-10-CM

## 2019-07-13 DIAGNOSIS — I255 Ischemic cardiomyopathy: Secondary | ICD-10-CM

## 2019-07-13 DIAGNOSIS — I4891 Unspecified atrial fibrillation: Secondary | ICD-10-CM

## 2019-07-13 DIAGNOSIS — I361 Nonrheumatic tricuspid (valve) insufficiency: Secondary | ICD-10-CM

## 2019-07-13 LAB — BASIC METABOLIC PANEL
Anion gap: 11 (ref 5–15)
BUN: 15 mg/dL (ref 8–23)
CO2: 22 mmol/L (ref 22–32)
Calcium: 8.8 mg/dL — ABNORMAL LOW (ref 8.9–10.3)
Chloride: 104 mmol/L (ref 98–111)
Creatinine, Ser: 1.09 mg/dL (ref 0.61–1.24)
GFR calc Af Amer: >60 mL/min (ref >60)
GFR calc non Af Amer: >60 mL/min (ref >60)
Glucose, Bld: 103 mg/dL — ABNORMAL HIGH (ref 70–99)
Potassium: 3.8 mmol/L (ref 3.5–5.1)
Sodium: 137 mmol/L (ref 135–145)

## 2019-07-13 LAB — CBC
HCT: 46.5 % (ref 39.0–52.0)
Hemoglobin: 15.8 g/dL (ref 13.0–17.0)
MCH: 29.6 pg (ref 26.0–34.0)
MCHC: 34 g/dL (ref 30.0–36.0)
MCV: 87.1 fL (ref 80.0–100.0)
Platelets: 230 10*3/uL (ref 150–400)
RBC: 5.34 MIL/uL (ref 4.22–5.81)
RDW: 13.8 % (ref 11.5–15.5)
WBC: 9.5 10*3/uL (ref 4.0–10.5)
nRBC: 0 % (ref 0.0–0.2)

## 2019-07-13 LAB — HEPATIC FUNCTION PANEL
ALT: 25 U/L (ref 0–44)
AST: 34 U/L (ref 15–41)
Albumin: 3.8 g/dL (ref 3.5–5.0)
Alkaline Phosphatase: 89 U/L (ref 38–126)
Bilirubin, Direct: 0.2 mg/dL (ref 0.0–0.2)
Indirect Bilirubin: 1 mg/dL — ABNORMAL HIGH (ref 0.3–0.9)
Total Bilirubin: 1.2 mg/dL (ref 0.3–1.2)
Total Protein: 6.8 g/dL (ref 6.5–8.1)

## 2019-07-13 LAB — ECHOCARDIOGRAM COMPLETE
Height: 74 in
Weight: 2696 oz

## 2019-07-13 LAB — NOVEL CORONAVIRUS, NAA (HOSP ORDER, SEND-OUT TO REF LAB; TAT 18-24 HRS): SARS-CoV-2, NAA: NOT DETECTED

## 2019-07-13 LAB — HEPARIN LEVEL (UNFRACTIONATED): Heparin Unfractionated: 0.39 IU/mL (ref 0.30–0.70)

## 2019-07-13 MED ORDER — RIVAROXABAN 20 MG PO TABS: 20.0000 mg | ORAL_TABLET | Freq: Every day | ORAL | 1 refills | Status: DC

## 2019-07-13 MED ORDER — METOPROLOL TARTRATE 25 MG PO TABS: 12.5000 mg | ORAL_TABLET | Freq: Two times a day (BID) | ORAL | 1 refills | Status: DC

## 2019-07-13 MED ORDER — LISINOPRIL 2.5 MG PO TABS: 2.5000 mg | ORAL_TABLET | Freq: Every day | ORAL | 1 refills | Status: DC

## 2019-07-13 MED ORDER — ATORVASTATIN CALCIUM 40 MG PO TABS: 40.0000 mg | ORAL_TABLET | Freq: Every day | ORAL | 1 refills | Status: DC

## 2019-07-13 NOTE — Progress Notes (Addendum)
Progress Note  Patient Name: JEX STRAUSBAUGH Date of Encounter: 07/13/2019  Primary Cardiologist: New, Dr. Rockey Situ rounding  Subjective   Patient denies CP, palpitations, racing HR. No symptoms in Afib.   Does report shortness of breath, which is the same as yesterday.  Inpatient Medications    Scheduled Meds: . aspirin EC  81 mg Oral Daily  . metoprolol tartrate  12.5 mg Oral BID   Continuous Infusions: . heparin 1,150 Units/hr (07/13/19 0431)   PRN Meds: acetaminophen **OR** acetaminophen, haloperidol lactate, ondansetron **OR** ondansetron (ZOFRAN) IV   Vital Signs    Vitals:   07/12/19 1745 07/12/19 1815 07/12/19 2001 07/13/19 0422  BP:  126/82 (!) 143/96 (!) 141/87  Pulse: (!) 103 92 95 99  Resp: 13  16 20   Temp:  97.6 F (36.4 C) 97.7 F (36.5 C) 97.8 F (36.6 C)  TempSrc:  Oral Oral Oral  SpO2: 98% 97% 100% 98%  Weight:  78.1 kg  76.4 kg  Height:  6\' 2"  (1.88 m)      Intake/Output Summary (Last 24 hours) at 07/13/2019 0830 Last data filed at 07/12/2019 1605 Gross per 24 hour  Intake -  Output 100 ml  Net -100 ml   Last 3 Weights 07/13/2019 07/12/2019 07/12/2019  Weight (lbs) 168 lb 8 oz 172 lb 1.6 oz 193 lb  Weight (kg) 76.431 kg 78.064 kg 87.544 kg      Telemetry    IRIR, rates  80-120s- Personally Reviewed  ECG    No new tracings- Personally Reviewed  Physical Exam   GEN: No acute distress.  Sitting in chair.  Neck: No JVD Cardiac: IRIR but not tachycardic, no murmurs, rubs, or gallops.  Respiratory: Clear to auscultation with reduced breath sounds at right base. GI: Soft, nontender, non-distended  MS: No edema; No deformity. Neuro:  Nonfocal  Psych: Normal affect, slight frustration  Labs    High Sensitivity Troponin:   Recent Labs  Lab 07/12/19 0946 07/12/19 1145  TROPONINIHS 4,318* 3,479*      Cardiac EnzymesNo results for input(s): TROPONINI in the last 168 hours. No results for input(s): TROPIPOC in the last 168 hours.    Chemistry Recent Labs  Lab 07/12/19 0946 07/13/19 0310  NA 138 137  K 3.8 3.8  CL 102 104  CO2 23 22  GLUCOSE 109* 103*  BUN 16 15  CREATININE 1.44* 1.09  CALCIUM 9.1 8.8*  PROT 7.6  --   ALBUMIN 4.3  --   AST 42*  --   ALT 27  --   ALKPHOS 95  --   BILITOT 1.1  --   GFRNONAA 47* >60  GFRAA 55* >60  ANIONGAP 13 11     Hematology Recent Labs  Lab 07/12/19 0946 07/13/19 0310  WBC 10.2 9.5  RBC 5.64 5.34  HGB 16.5 15.8  HCT 50.7 46.5  MCV 89.9 87.1  MCH 29.3 29.6  MCHC 32.5 34.0  RDW 14.0 13.8  PLT 250 230    BNPNo results for input(s): BNP, PROBNP in the last 168 hours.   DDimer No results for input(s): DDIMER in the last 168 hours.   Radiology    Ct Head Wo Contrast  Result Date: 07/12/2019 CLINICAL DATA:  73 year old with encephalopathy. EXAM: CT HEAD WITHOUT CONTRAST TECHNIQUE: Contiguous axial images were obtained from the base of the skull through the vertex without intravenous contrast. COMPARISON:  MRI report from 02/22/2003.  Images not available. FINDINGS: Brain: No evidence for acute hemorrhage,  mass lesion, midline shift, hydrocephalus or large infarct. Low-density in the white matter is suggestive for chronic changes. Vascular: No hyperdense vessel or unexpected calcification. Skull: Normal. Negative for fracture or focal lesion. Sinuses/Orbits: Visualized paranasal sinuses are clear. Other: None IMPRESSION: 1. No acute intracranial abnormality. 2. Low-density in the white matter is suggestive for chronic small vessel ischemic changes. Electronically Signed   By: Richarda OverlieAdam  Henn M.D.   On: 07/12/2019 10:16    Cardiac Studies   Pending echo  Patient Profile     [age] y.o. male hx of newly diagnosed atrial fibrillation with RVR, current tobacco use (10 cigarettes / day), and reportedly "sleep issues" who is being seen today for the evaluation of NSTEMI s/p syncopal event 8/25.  Assessment & Plan    NSTEMI -Patient denies any chest pain.  Continued SOB.   S/p earlier syncopal episode. --EKG with changes including inferior Q waves, nonspecific T wave abn. --High-sensitivity troponin peaked 4318.  Now down-trending. --Risk factors for cardiac etiology include current tobacco use, HLD, DM2. --Recommend ischemic work-up recommended with cardiac catheterization, if patient agreeable to this procedure, or stress test.  Given the patient's initial frustration and mental status in the ED, ischemic workup deferred until patient able to consent for workup / procedures. Thin liquid diet started overnight. --Continue heparin with CBC. Continue ASA and low dose beta-blocker. Before discharge, recommend initiation of statin therapy given LDL. Pending echo EF assessment (rough assessment in room shows reduced EF), consider ACE inhibitor. Further recommendations pending echo. If EF significantly reduced, further ischemic workup pending consent and Zio monitor recommended. Will need to also consider medical / treatment compliance with further workup and ischemic intervention/PCI.  PAF, newly diagnosed --New diagnosis.  Patient denies a past history of arrhythmia or paroxysmal atrial fibrillation. SOB. Unclear onset. --CHA2DS2VASc score of at least 3 (with further work-up pending: HTN, age, DM2) with [facility]. Will update score as workup progresses, including EF / echo results. For now, continue heparin for anticoagulation. Consider medical compliance with OAC as above. --No plan for TEE/DCCV or DCCV at this time until can confirm medical compliance and ability to consent. --Rate control with BB titration as tolerated by BP.  --TSH 2.338. Electrolytes with K 3.8; Mg 2.3. --Consider Zio at discharge to assess Afib /ectopy burden, as well as monitor for additional arrhythmia at discharge given syncopal event. Pending echo.If EF reduced, further ischemic workup and Afib burden / Zio monitor recommended as above (and given syncope).   Syncope  --Etiology of syncope unclear but suspect cardiac in nature given elevated Tn as above, as well as Atrial fibrillation with onset unknown, given patient appears asymptomatic. Afib noted at admission and appears a new diagnosis. --Recommend Zio (live d/t syncope) at discharge as above to ensure patient does not have additional arrhthymias not caught on telemetry and assess burden of Afib. --Recommend echo this admission to assess for any valvular disease or acute structural changes. Further recommendations following echo and ischemic workup/Zio.  HTN --BP consistently elevated. Recommend BB +/- ACE before discharge if renal function allows. Titrate as HR allows for optimal BP control.  HLD --Checking liver function. Statin recommended as above.  DM2 --SSI. Per IM  CKD --Daily BMET. Current Cr 1.44  1.09, and unknown if elevated from baseline.  Current tobacco use  --Cessation advised. 10 cigarettes daily.   For questions or updates, please contact [facility] Please consult [email or web address] for contact info under  Signed, Lennon Alstrom, PA-C  07/13/2019, 8:30 AM

## 2019-07-13 NOTE — Telephone Encounter (Signed)
   Spoke again with patient's wife. She will be coming into the hospital today 8/26 at [place].  She reported her husband has always been brief in his response to questions at baseline. She stated that he has gotten increasingly brief in his responses over the year. When asked about his baseline memory, she replied, "his memory is better than mine."  Regarding his cognition, she did note that occasionally he will miss the punch-line on television shows, but otherwise seemed to follow conversations well. She reported he is able to complete daily living activities and go places on his own. When asked about his ability to comply with medical treatment, she reported "he does not like MDs or medicine." She added, "he takes daily supplements and uses his nasal spray each day just fine."  Will plan to further discuss current medical assessment and treatment plan options with patient once wife has arrived in the hospital.  Signed, Arvil Chaco, PA-C 07/13/2019, 9:30 AM Pager [phone]

## 2019-07-13 NOTE — Discharge Summary (Signed)
[facility] NAME: Danny Gilmore    MR#:  [id number]  DATE OF BIRTH:  [birth year]  DATE OF ADMISSION:  07/12/2019 ADMITTING PHYSICIAN: Henreitta Leber, MD  DATE OF DISCHARGE: 07/13/2019  4:18 PM  PRIMARY CARE PHYSICIAN: Patient, No Pcp Per    ADMISSION DIAGNOSIS:  NSTEMI (non-ST elevated myocardial infarction) ([place]) [I21.4]  DISCHARGE DIAGNOSIS:  Active Problems:   NSTEMI (non-ST elevated myocardial infarction) ([place])   Atrial fibrillation ([place])   SECONDARY DIAGNOSIS:   Past Medical History:  Diagnosis Date  . CKD (chronic kidney disease)   . DM2 (diabetes mellitus, type 2) ([place])   . HFrEF (heart failure with reduced ejection fraction) ([place])   . Hyperlipidemia   . Hypertension   . NSTEMI (non-ST elevated myocardial infarction) ([place])   . Paroxysmal atrial fibrillation ([facility])    Dx 8/25; not on [place] at this time  . Syncope and collapse   . Tobacco use     HOSPITAL COURSE:   73 year old male with no significant past medical history who presents to the hospital due to altered mental status and ruled in for a non-ST elevation MI noted to be in new onset atrial fibrillation/flutter.  1.  Non-ST elevation MI-patient presents to the hospital with altered mental status but incidentally noted to have elevated troponin.  He denied any chest pain shortness of breath.  -Patient was seen by cardiology and started on medical management by cardiology on a heparin drip, aspirin low-dose beta-blockers. -He was never clinically symptomatic.  Cardiology did not recommend any intervention given his dementia and altered mental status. -Patient is being managed medically and therefore being discharged on beta-blocker and statin and outpatient follow-up with cardiology.  Patient did have an echocardiogram done which showed EF of 40 to 45%.  2.  New onset atrial fibrillation/flutter-rate controlled presently. -Patient rates remained controlled,  patient will continue low-dose metoprolol.  Cardiology did recommend anticoagulation and given patient's compliance issues he was discharged just on Xarelto daily.  He will follow-up with cardiology as an outpatient. Patient's echocardiogram showed EF of 40 to 45% with mild diffuse hypokinesis and suggestive of inferior/inferior lateral MI.  3.  Acute kidney injury- to dehydration, improved with IV fluids and currently normalized.  4. AMS/Encephalopathy -secondary to patient's underlying dementia, CT head was negative admission.  Patient received some as needed Haldol for agitation and improved.  Patient is being discharged home with outpatient follow-up with PCP. - Patient likely has underlying dementia and will need further assistance regarding this by her his PCP.  DISCHARGE CONDITIONS:   Stable.   CONSULTS OBTAINED:  Treatment Team:  Nelva Bush, MD  DRUG ALLERGIES:  Not on File  DISCHARGE MEDICATIONS:   Allergies as of 07/13/2019   Not on File     Medication List    TAKE these medications   atorvastatin 40 MG tablet Commonly known as: LIPITOR Take 1 tablet (40 mg total) by mouth daily.   lisinopril 2.5 MG tablet Commonly known as: ZESTRIL Take 1 tablet (2.5 mg total) by mouth daily.   metoprolol tartrate 25 MG tablet Commonly known as: LOPRESSOR Take 0.5 tablets (12.5 mg total) by mouth 2 (two) times daily.   rivaroxaban 20 MG Tabs tablet Commonly known as: XARELTO Take 1 tablet (20 mg total) by mouth daily with supper.         DISCHARGE INSTRUCTIONS:   DIET:  Cardiac diet  DISCHARGE CONDITION:  Stable  ACTIVITY:  Activity  as tolerated  OXYGEN:  Home Oxygen: No.   Oxygen Delivery: room air  DISCHARGE LOCATION:  home   If you experience worsening of your admission symptoms, develop shortness of breath, life threatening emergency, suicidal or homicidal thoughts you must seek medical attention immediately by calling 911 or calling your MD  immediately  if symptoms less severe.  You Must read complete instructions/literature along with all the possible adverse reactions/side effects for all the Medicines you take and that have been prescribed to you. Take any new Medicines after you have completely understood and accpet all the possible adverse reactions/side effects.   Please note  You were cared for by a hospitalist during your hospital stay. If you have any questions about your discharge medications or the care you received while you were in the hospital after you are discharged, you can call the unit and asked to speak with the hospitalist on call if the hospitalist that took care of you is not available. Once you are discharged, your primary care physician will handle any further medical issues. Please note that NO REFILLS for any discharge medications will be authorized once you are discharged, as it is imperative that you return to your primary care physician (or establish a relationship with a primary care physician if you do not have one) for your aftercare needs so that they can reassess your need for medications and monitor your lab values.     Today   Patient still remains confused.  Wife is at bedside.  She thinks he would be better off at home given his dementia and likely high risk of sundowning.  Patient is ambulating well, he is clinically asymptomatic.  Discussed with cardiology will discharge home on oral Xarelto and rate controlling meds.  VITAL SIGNS:  Blood pressure (!) 140/91, pulse (!) 104, temperature 98.3 F (36.8 C), temperature source Oral, resp. rate 20, height 6\' 2"  (1.88 m), weight 76.4 kg, SpO2 97 %.  I/O:    Intake/Output Summary (Last 24 hours) at 07/13/2019 1658 Last data filed at 07/13/2019 1026 Gross per 24 hour  Intake 240 ml  Output -  Net 240 ml    PHYSICAL EXAMINATION:  GENERAL:  [age] y.o.-year-old patient sitting in chair agitated trying to take his mits off.  EYES: Pupils equal,  round, reactive to light and accommodation. No scleral icterus. Extraocular muscles intact.  HEENT: Head atraumatic, normocephalic. Oropharynx and nasopharynx clear.  NECK:  Supple, no jugular venous distention. No thyroid enlargement, no tenderness.  LUNGS: Normal breath sounds bilaterally, no wheezing, rales,rhonchi. No use of accessory muscles of respiration.  CARDIOVASCULAR: S1, S2 normal. No murmurs, rubs, or gallops.  ABDOMEN: Soft, non-tender, non-distended. Bowel sounds present. No organomegaly or mass.  EXTREMITIES: No pedal edema, cyanosis, or clubbing.  NEUROLOGIC: Cranial nerves II through XII are intact. No focal motor or sensory defecits b/l.  PSYCHIATRIC: The patient is alert and oriented x 1.  SKIN: No obvious rash, lesion, or ulcer.   DATA REVIEW:   CBC Recent Labs  Lab 07/13/19 0310  WBC 9.5  HGB 15.8  HCT 46.5  PLT 230    Chemistries  Recent Labs  Lab 07/12/19 0946 07/13/19 0310  NA 138 137  K 3.8 3.8  CL 102 104  CO2 23 22  GLUCOSE 109* 103*  BUN 16 15  CREATININE 1.44* 1.09  CALCIUM 9.1 8.8*  MG 2.3  --   AST 42* 34  ALT 27 25  ALKPHOS 95 89  BILITOT 1.1  1.2    Cardiac Enzymes No results for input(s): TROPONINI in the last 168 hours.  Microbiology Results  Results for orders placed or performed during the hospital encounter of 07/12/19  Novel Coronavirus, NAA (Hosp order, Send-out to Ref Lab; TAT 18-24 hrs     Status: None   Collection Time: 07/12/19 10:37 AM   Specimen: Nasopharyngeal Swab; Respiratory  Result Value Ref Range Status   SARS-CoV-2, NAA NOT DETECTED NOT DETECTED Final    Comment: (NOTE) This test was developed and its performance characteristics determined by [employer]. This test has not been FDA cleared or approved. This test has been authorized by FDA under an Emergency Use Authorization (EUA). This test is only authorized for the duration of time the declaration that circumstances exist justifying the  authorization of the emergency use of in vitro diagnostic tests for detection of SARS-CoV-2 virus and/or diagnosis of COVID-19 infection under section 564(b)(1) of the Act, 21 U.S.C. 824MPN-3(I)(1), unless the authorization is terminated or revoked sooner. When diagnostic testing is negative, the possibility of a false negative result should be considered in the context of a patient's recent exposures and the presence of clinical signs and symptoms consistent with COVID-19. An individual without symptoms of COVID-19 and who is not shedding SARS-CoV-2 virus would expect to have a negative (not detected) result in this assay. Performed  At: [facility] [address] [place], [place] [id number] Jolene Schimke MD QP:[id number]    Coronavirus Source NASOPHARYNGEAL  Final    Comment: Performed at [facility], [address] [place] [place], [place] [address]    RADIOLOGY:  Ct Head Wo Contrast  Result Date: 07/12/2019 CLINICAL DATA:  73 year old with encephalopathy. EXAM: CT HEAD WITHOUT CONTRAST TECHNIQUE: Contiguous axial images were obtained from the base of the skull through the vertex without intravenous contrast. COMPARISON:  MRI report from 02/22/2003.  Images not available. FINDINGS: Brain: No evidence for acute hemorrhage, mass lesion, midline shift, hydrocephalus or large infarct. Low-density in the white matter is suggestive for chronic changes. Vascular: No hyperdense vessel or unexpected calcification. Skull: Normal. Negative for fracture or focal lesion. Sinuses/Orbits: Visualized paranasal sinuses are clear. Other: None IMPRESSION: 1. No acute intracranial abnormality. 2. Low-density in the white matter is suggestive for chronic small vessel ischemic changes. Electronically Signed   By: Richarda Overlie M.D.   On: 07/12/2019 10:16      Management plans discussed with the patient, family and they are in agreement.  CODE STATUS:     Code Status Orders  (From admission,  onward)         Start     Ordered   07/12/19 1828  Full code  Continuous     07/12/19 1828        TOTAL TIME TAKING CARE OF THIS PATIENT: 40 minutes.    Houston Siren M.D on 07/13/2019 at 4:58 PM  Between 7am to 6pm - Pager - [phone]  After 6pm go to [email or web address] - Social research officer, government  [facility]  Office  [phone]  CC: Primary care physician; Patient, No Pcp Per

## 2019-07-13 NOTE — Progress Notes (Signed)
IVs and tele removed from patient. Discharge instructions given to patients wife. Verbalized understanding. No acute distress at this time. Wife to transport patient home.

## 2019-07-13 NOTE — Progress Notes (Signed)
   Discussed treatment and workup options with patient and wife.   Patient agreeable to medical therapy and follow-up in the office. Patient does not want further workup with stress testing or cardiac catheterization this admission. Wife stated she is agreeable to this plan as well.

## 2019-07-13 NOTE — Progress Notes (Signed)
*  PRELIMINARY RESULTS* Echocardiogram 2D Echocardiogram has been performed.  Danny Gilmore, [place] 07/13/2019, 8:54 AM

## 2019-07-13 NOTE — Progress Notes (Signed)
ANTICOAGULATION CONSULT NOTE - Initial Consult  Pharmacy Consult for Herparin drip Indication: ACS/STEMI  Not on File  Patient Measurements: Height: 6\' 2"  (188 cm) Weight: 172 lb 1.6 oz (78.1 kg) IBW/kg (Calculated) : 82.2 Heparin Dosing Weight: 87.5  Vital Signs: Temp: 97.7 F (36.5 C) (08/25 2001) Temp Source: Oral (08/25 2001) BP: 143/96 (08/25 2001) Pulse Rate: 95 (08/25 2001)  Labs: Recent Labs    07/12/19 0946 07/12/19 1034 07/12/19 1145 07/12/19 1843 07/13/19 0310  HGB 16.5  --   --   --  15.8  HCT 50.7  --   --   --  46.5  PLT 250  --   --   --  230  APTT  --  33  --   --   --   LABPROT  --  12.9  --   --   --   INR  --  1.0  --   --   --   HEPARINUNFRC  --   --   --  0.37 0.39  CREATININE 1.44*  --   --   --  1.09  TROPONINIHS 4,318*  --  3,479*  --   --     Estimated Creatinine Clearance: 65.7 mL/min (by C-G formula based on SCr of 1.09 mg/dL).   Medical History: History reviewed. No pertinent past medical history.  Medications:  Scheduled:  . aspirin EC  81 mg Oral Daily  . metoprolol tartrate  12.5 mg Oral BID   Infusions:  . heparin 1,150 Units/hr (07/12/19 1100)    Assessment: 73 yo M to start Heparin drip for ACS/STEMI, increased troponins. Pt confused. According to son, per ER note, pt does not take any medications PTA Hgb 16.5  Plt 250  aptt 33   INR 1.0  08/25@1843 : HL 7.09  Goal of Therapy:  Heparin level 0.3-0.7 units/ml Monitor platelets by anticoagulation protocol: Yes   Plan:  08/26 @ 0300 HL 0.39 therapeutic. Will continue current rate and will recheck HL w/ am labs, CBC stable will continue to monitor.  Tobie Lords, PharmD, BCPS Clinical Pharmacist 07/13/2019,4:05 AM

## 2019-07-15 ENCOUNTER — Telehealth: Payer: Self-pay | Admitting: *Deleted

## 2019-07-15 NOTE — Telephone Encounter (Signed)
Cardiovascular and Pulmonary Nurse Navigator Note:    Contacted patient via phone to discuss Cardiac Rehab referral.  Patient left hospital without cardiac catheterization earlier this week.  Cardiac catheterization had been recommended due to elevated high sensitivity troponin and NSTEMI.  Spoke to patient initially.  Patient declined participation in Cardiac Rehab.  Patient stated, "I seem to be doing okay right now."  This RN then spoke with patient's wife.  Patient's wife stated, "You do realize he has dementia."  I explained that I was aware and had planned to speak with her as well.  Wife stated patient can be combative at times and there is no way she thinks she will be able to get him to Cardiac Rehab. Wife reporting that patient can be hard to manage.  Wife also reporting it is all she can do to keep him inside at night because he wants to be outside all the time.  Patient's wife also informed this RN that patient is complaining of bilateral calf pain.  Wife suspects its a problem with circulation because sometimes his feet go numb and he falls.   Patient walks all the time.  Patient's wife reporting that patient walks from the time his feet hit the floor.  Patient does not have PCP yet.  Encouraged wife to schedule an appt with PCP as soon as possible.  Patient is also a new diabetic.  Encouraged wife to take patient to his follow-up appt with cardiologist.    Wife verbalized understanding.    Patient (and wife) have declined participation in Cardiac Rehab.    Roanna Epley, RN, BSN, [facility]  Cardiovascular & Pulmonary Nurse Navigator  Direct Line: [phone]  Department Phone #: [phone] Fax: [phone]  Email Address: [email or web address]

## 2019-07-20 DIAGNOSIS — Z09 Encounter for follow-up examination after completed treatment for conditions other than malignant neoplasm: Secondary | ICD-10-CM | POA: Diagnosis not present

## 2019-07-20 DIAGNOSIS — I480 Paroxysmal atrial fibrillation: Secondary | ICD-10-CM | POA: Diagnosis not present

## 2019-07-20 DIAGNOSIS — Z1321 Encounter for screening for nutritional disorder: Secondary | ICD-10-CM | POA: Diagnosis not present

## 2019-07-20 DIAGNOSIS — Z7189 Other specified counseling: Secondary | ICD-10-CM | POA: Diagnosis not present

## 2019-07-20 DIAGNOSIS — E538 Deficiency of other specified B group vitamins: Secondary | ICD-10-CM | POA: Diagnosis not present

## 2019-07-20 DIAGNOSIS — F0150 Vascular dementia without behavioral disturbance: Secondary | ICD-10-CM | POA: Diagnosis not present

## 2019-07-20 DIAGNOSIS — I214 Non-ST elevation (NSTEMI) myocardial infarction: Secondary | ICD-10-CM | POA: Diagnosis not present

## 2019-07-20 DIAGNOSIS — Z7689 Persons encountering health services in other specified circumstances: Secondary | ICD-10-CM | POA: Diagnosis not present

## 2019-07-25 NOTE — Progress Notes (Signed)
Cardiology Office Note    Date:  07/28/2019   ID:  Danny MarekJerry L Gambino, DOB [birth year], MRN [id number]  PCP:  Patient, No Pcp Per  Cardiologist:  Yvonne Kendallhristopher End, MD  Electrophysiologist:  None   Chief Complaint: Hospital follow up  History of Present Illness:   Danny Gilmore is a 73 y.o. male with history of CAD with recent NSTEMI medically managed as below, recently diagnosed Afib in 06/2019, HFrEF, questionable history of dementia, hyperglycemia, syncope, HTN, HLD, and tobacco abuse who presents for hospital follow up after recent admission to [facility] fom 8/25 through 8/26 for syncope, NSTEMI, and newly diagnosed Afib.  Prior to the above admission, the patient had not seen a physician in many years. [facility] following a [facility]opal episode at [facility]. Upon his arrival to the hospital, he was noted to be in Afib with RVR. Initial high-sensitivity troponin of 4318 with a delta of 3479. CT head was negative. Renal function was mildly elevated at 1.4 with baseline unknown. EKG showed Afib with inferior Q waves and nonspecific st/t changes. Echo showed an EF of 40-45%, diffuse hypokinesis with images suggestive of moderate hypokinesis of the inferior and inferolateral wall, normal RVSF and cavity size, RVSP 32.8 mmHg, moderate MR, small pericardial effusion. Cardiac cath was discussed with the patient, though there were concerns if the patient could consent or if he would be compliant. In this setting, cardiac cath was deferred while admitted. Patient and wife declined further inpatient cardiac work up. Given his newly diagnosed Afib, he was started on Xarelto at discharge with a CHADS2VASc of at least 4 (CHF, HTN, age x 1, vascular disease).  Since his discharge, the patient has been established with a new PCP and diagnosed with vascular dementia.  He comes in today by himself though I do have a phone number and his permission to contact both his wife and son.  Patient indicates he has  done well since his discharge and denies any chest pain, shortness of breath, lower extremity swelling, palpitations, dizziness, presyncope, or syncope.  He denies any abdominal distention, orthopnea, PND, or early satiety.  He has not had any falls since his discharge.  He denies any BRBPR or melena.  He does report compliance with medications though states he would prefer to not take medications moving forward.  He continues to state he would prefer not to move forward with a cardiac cath.  I did get his permission to talk with his wife and son over the phone and did so.  After talking with them they are uncertain if they would want the patient to undergo cardiac cath moving forward given his underlying cognitive issues.  They would like to discuss this further amongst themselves and the patient in follow-up in short-term.  He remains without driving privileges given his recent non-STEMI and recently diagnosed vascular dementia.   Labs: 06/2019 - WBC 9.5, HGB 15.8, PLT 230, AST/ALT normal, albumin 3.8, potassium 3.8, SCr 1.09, UDS negative, A1c 6.2, TSH 2.338, magnesium 2.3, TC 194, TG 127, HDL 30, LDL 139, ethanol < 10    Past Medical History:  Diagnosis Date   HFrEF (heart failure with reduced ejection fraction) (HCC)    a. EF 40-45%, diffuse HK w/ images suggestive of mod HK of the inf & inferolat wall, normal RVSF and cavity size, RVSP 32.8 mmHg, mod MR, small pericardial effusion   Hyperlipidemia    Hypertension    NSTEMI (non-ST elevated myocardial infarction) (HCC)  Paroxysmal atrial fibrillation (HCC)    a. diagnosed 06/2019; b. CHADS2VASc 4 (CHF, HTN, age x 1, vascular dz); c. Xarelto   Syncope and collapse    Tobacco use     No past surgical history on file.  Current Medications: Current Meds  Medication Sig   atorvastatin (LIPITOR) 40 MG tablet Take 1 tablet (40 mg total) by mouth daily.   lisinopril (ZESTRIL) 2.5 MG tablet Take 1 tablet (2.5 mg total) by mouth daily.    metoprolol tartrate (LOPRESSOR) 25 MG tablet Take 0.5 tablets (12.5 mg total) by mouth 2 (two) times daily.   rivaroxaban (XARELTO) 20 MG TABS tablet Take 1 tablet (20 mg total) by mouth daily with supper.    Allergies:   Patient has no allergy information on record.   Social History   Socioeconomic History   Marital status: Married    Spouse name: Not on file   Number of children: Not on file   Years of education: Not on file   Highest education level: Not on file  Occupational History   Not on file  Social Needs   Financial resource strain: Not on file   Food insecurity    Worry: Not on file    Inability: Not on file   Transportation needs    Medical: Not on file    Non-medical: Not on file  Tobacco Use   Smoking status: Current Every Day Smoker    Packs/day: 0.50    Years: 50.00    Pack years: 25.00    Types: Cigarettes   Smokeless tobacco: Never Used  Substance and Sexual Activity   Alcohol use: Not Currently    Frequency: Never   Drug use: Not Currently   Sexual activity: Not on file  Lifestyle   Physical activity    Days per week: Not on file    Minutes per session: Not on file   Stress: Not on file  Relationships   Social connections    Talks on phone: Not on file    Gets together: Not on file    Attends religious service: Not on file    Active member of club or organization: Not on file    Attends meetings of clubs or organizations: Not on file    Relationship status: Not on file  Other Topics Concern   Not on file  Social History Narrative   Not on file     Family History:  The patient's family history includes Cancer in his father; Stroke in his mother.  ROS:   Review of Systems  Constitutional: Positive for malaise/fatigue. Negative for chills, diaphoresis, fever and weight loss.  HENT: Negative for congestion.   Eyes: Negative for discharge and redness.  Respiratory: Negative for cough, hemoptysis, sputum production,  shortness of breath and wheezing.   Cardiovascular: Negative for chest pain, palpitations, orthopnea, claudication, leg swelling and PND.  Gastrointestinal: Negative for abdominal pain, blood in stool, heartburn, melena, nausea and vomiting.  Genitourinary: Negative for hematuria.  Musculoskeletal: Negative for falls and myalgias.  Skin: Negative for rash.  Neurological: Positive for weakness. Negative for dizziness, tingling, tremors, sensory change, speech change, focal weakness and loss of consciousness.  Endo/Heme/Allergies: Does not bruise/bleed easily.  Psychiatric/Behavioral: Negative for substance abuse. The patient is not nervous/anxious.   All other systems reviewed and are negative.    EKGs/Labs/Other Studies Reviewed:    Studies reviewed were summarized above. The additional studies were reviewed today:  2D Echo 06/2019: 1. The left ventricle  has mild-moderately reduced systolic function, with an ejection fraction of 40-45%. The cavity size was mildly dilated. Left ventricular with mild diffuse hypokinesis, images suggestive of moderate hypokinesis of the inferior and  inferolateral wall.  2. The right ventricle has normal systolic function. The cavity was normal. There is no increase in right ventricular wall thickness. Right ventricular systolic pressure is mildly elevated with an estimated pressure of 32.8 mmHg.  3. Mitral valve regurgitation is moderate  4. Small pericardial effusion.   EKG:  EKG is ordered today.  The EKG ordered today demonstrates A. fib, 80 bpm, baseline wandering, nonspecific ST-T changes  Recent Labs: 07/12/2019: Magnesium 2.3; TSH 2.338 07/13/2019: ALT 25; BUN 15; Creatinine, Ser 1.09; Hemoglobin 15.8; Platelets 230; Potassium 3.8; Sodium 137  Recent Lipid Panel    Component Value Date/Time   CHOL 194 07/12/2019 0946   TRIG 127 07/12/2019 0946   HDL 30 (L) 07/12/2019 0946   CHOLHDL 6.5 07/12/2019 0946   VLDL 25 07/12/2019 0946   LDLCALC 139  (H) 07/12/2019 0946    PHYSICAL EXAM:    VS:  BP (!) 112/58 (BP Location: Left Arm, Patient Position: Sitting, Cuff Size: Normal)    Pulse 80    Ht 6' (1.829 m)    Wt 170 lb (77.1 kg)    BMI 23.06 kg/m   BMI: Body mass index is 23.06 kg/m.  Physical Exam  Constitutional: He is oriented to person, place, and time. He appears well-developed and well-nourished.  HENT:  Head: Normocephalic and atraumatic.  Eyes: Right eye exhibits no discharge. Left eye exhibits no discharge.  Neck: Normal range of motion. No JVD present.  Cardiovascular: Normal rate, S1 normal, S2 normal and normal heart sounds. An irregularly irregular rhythm present. Exam reveals no distant heart sounds, no friction rub, no midsystolic click and no opening snap.  No murmur heard. Pulses:      Posterior tibial pulses are 2+ on the right side and 2+ on the left side.  Pulmonary/Chest: Effort normal and breath sounds normal. No respiratory distress. He has no decreased breath sounds. He has no wheezes. He has no rales. He exhibits no tenderness.  Abdominal: Soft. He exhibits no distension. There is no abdominal tenderness.  Musculoskeletal:        General: No edema.  Neurological: He is alert and oriented to person, place, and time.  Skin: Skin is warm and dry. No cyanosis. Nails show no clubbing.  Psychiatric: He has a normal mood and affect. His speech is normal and behavior is normal. Judgment and thought content normal.    Wt Readings from Last 3 Encounters:  07/28/19 170 lb (77.1 kg)  07/13/19 168 lb 8 oz (76.4 kg)     ASSESSMENT & PLAN:   1. CAD involving the native coronary arteries with recent non-STEMI without angina: No symptoms of angina.  Cardiac cath was deferred in the inpatient setting given concerns for dementia, with subsequent diagnosis of vascular dementia, as well as with compliance issues.  Patient continues to state he does not want cardiac cath at this time.  I did call and speak with his wife  and son who indicate they are uncertain if they would want the patient to undergo this procedure either and would like to discuss it amongst themselves and the patient with short-term follow-up with his primary cardiologist next month.  In the above setting, we have agreed to defer ischemic evaluation at this time with continued medical management.  He remains on Xarelto  in place of aspirin.  Given a recent non-STEMI without ischemic evaluation, and in the setting of vascular dementia, I recommend no further driving.  2. A. Fib: He remains in A. fib with controlled ventricular response.  He is asymptomatic.  The duration of his A. fib remains uncertain given he has not been followed in the outpatient setting in many years.  In the setting of the patient being asymptomatic and with unknown chronicity we will continue rate control strategy at this time with metoprolol.  He remains on Xarelto without any bleeding issues at this time.  Check BMP and CBC.  Given the patient's underlying vascular dementia we will have to determine moving forward if anticoagulation remains safe for him.  3. 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  Not currently requiring standing diuretic.  Remains on metoprolol and lisinopril.  Given issues with dementia and compliance I am hesitant to add spironolactone at this time.  This can be discussed with the patient and family present at his next visit with his primary cardiologist in 1 month.  Check BMP.  4. Vascular dementia: As noted on recent visit with PCP earlier this month.  Given this diagnosis and after discussion with patient, his wife, and his son we have agreed to defer ischemic evaluation at today's visit as well as potential restoration of sinus rhythm as these would commit the patient to antiplatelet/anticoagulation therapy moving forward.  The patient verbalizes compliance issues with medications and is uncertain if he would like to take  these medications moving forward.  I feel it would be best for him to be seen in short-term follow-up with his primary cardiologist and with family present to discuss his care moving forward in the setting of his dementia.  Given his vascular dementia and recent MI I recommend the patient no longer drive; however from a dementia perspective this is deferred to his PCP.  5. Hyperglycemia: Recent A1c 6.2.  Follow-up with PCP as directed.  6. Hyperlipidemia: LDL of 139 last month at the time of his non-STEMI.  Goal LDL less than 70.  He was not on statin therapy at time of this venipuncture.  Recommend rechecking fasting lipid and liver function in approximately 8 weeks.  7. Hypertension: Blood pressure is reasonably controlled.  Continue current doses of Lopressor and lisinopril.  8. Lower extremity discomfort: Patient indicates his legs become restless, particularly in the evening hours, with symptom improvement with movement and ambulation.  This is not consistent with claudication.  I recommend he follow-up with his PCP for this.  Disposition: F/u with Dr. Saunders Revel in 1 month.   Medication Adjustments/Labs and Tests Ordered: Current medicines are reviewed at length with the patient today.  Concerns regarding medicines are outlined above. Medication changes, Labs and Tests ordered today are summarized above and listed in the Patient Instructions accessible in Encounters.   Signed, Christell Faith, PA-C 07/28/2019 10:23 AM     [facility] [address] [place] [address] [place], [place] [address] [phone]

## 2019-07-26 ENCOUNTER — Encounter: Payer: Self-pay | Admitting: Physician Assistant

## 2019-07-28 ENCOUNTER — Encounter: Payer: Self-pay | Admitting: Physician Assistant

## 2019-07-28 ENCOUNTER — Ambulatory Visit (INDEPENDENT_AMBULATORY_CARE_PROVIDER_SITE_OTHER): Payer: PPO | Admitting: Physician Assistant

## 2019-07-28 ENCOUNTER — Other Ambulatory Visit: Payer: Self-pay

## 2019-07-28 VITALS — BP 112/58 | HR 80 | Ht 72.0 in | Wt 170.0 lb

## 2019-07-28 DIAGNOSIS — I10 Essential (primary) hypertension: Secondary | ICD-10-CM | POA: Diagnosis not present

## 2019-07-28 DIAGNOSIS — M79605 Pain in left leg: Secondary | ICD-10-CM | POA: Diagnosis not present

## 2019-07-28 DIAGNOSIS — R739 Hyperglycemia, unspecified: Secondary | ICD-10-CM

## 2019-07-28 DIAGNOSIS — M79604 Pain in right leg: Secondary | ICD-10-CM | POA: Diagnosis not present

## 2019-07-28 DIAGNOSIS — I2510 Atherosclerotic heart disease of native coronary artery without angina pectoris: Secondary | ICD-10-CM | POA: Diagnosis not present

## 2019-07-28 DIAGNOSIS — I4819 Other persistent atrial fibrillation: Secondary | ICD-10-CM | POA: Diagnosis not present

## 2019-07-28 DIAGNOSIS — E785 Hyperlipidemia, unspecified: Secondary | ICD-10-CM

## 2019-07-28 DIAGNOSIS — F0150 Vascular dementia without behavioral disturbance: Secondary | ICD-10-CM | POA: Diagnosis not present

## 2019-07-28 DIAGNOSIS — I5022 Chronic systolic (congestive) heart failure: Secondary | ICD-10-CM | POA: Diagnosis not present

## 2019-07-28 DIAGNOSIS — I255 Ischemic cardiomyopathy: Secondary | ICD-10-CM

## 2019-07-28 NOTE — Patient Instructions (Signed)
Medication Instructions:  Your physician recommends that you continue on your current medications as directed. Please refer to the Current Medication list given to you today.  If you need a refill on your cardiac medications before your next appointment, please call your pharmacy.   Lab work: Your physician recommends that you have lab work today(BMET, CBC)  If you have labs (blood work) drawn today and your tests are completely normal, you will receive your results only by: Marland Kitchen MyChart Message (if you have MyChart) OR . A paper copy in the mail If you have any lab test that is abnormal or we need to change your treatment, we will call you to review the results.  Testing/Procedures: None ordered   Follow-Up: At [facility], you and your health needs are our priority.  As part of our continuing mission to provide you with exceptional heart care, we have created designated Provider Care Teams.  These Care Teams include your primary Cardiologist (physician) and Advanced Practice Providers (APPs -  Physician Assistants and Nurse Practitioners) who all work together to provide you with the care you need, when you need it. You will need a follow up appointment in 1 months.  Please see Nelva Bush, MD only.

## 2019-07-29 ENCOUNTER — Telehealth: Payer: Self-pay | Admitting: *Deleted

## 2019-07-29 ENCOUNTER — Telehealth: Payer: Self-pay | Admitting: Internal Medicine

## 2019-07-29 DIAGNOSIS — I10 Essential (primary) hypertension: Secondary | ICD-10-CM

## 2019-07-29 DIAGNOSIS — Z79899 Other long term (current) drug therapy: Secondary | ICD-10-CM

## 2019-07-29 LAB — BASIC METABOLIC PANEL
BUN/Creatinine Ratio: 11 (ref 10–24)
BUN: 15 mg/dL (ref 8–27)
CO2: 19 mmol/L — ABNORMAL LOW (ref 20–29)
Calcium: 9.8 mg/dL (ref 8.6–10.2)
Chloride: 105 mmol/L (ref 96–106)
Creatinine, Ser: 1.41 mg/dL — ABNORMAL HIGH (ref 0.76–1.27)
GFR calc Af Amer: 56 mL/min/{1.73_m2} — ABNORMAL LOW (ref >59)
GFR calc non Af Amer: 49 mL/min/{1.73_m2} — ABNORMAL LOW (ref >59)
Glucose: 87 mg/dL (ref 65–99)
Potassium: 5.1 mmol/L (ref 3.5–5.2)
Sodium: 142 mmol/L (ref 134–144)

## 2019-07-29 LAB — CBC
Hematocrit: 50.6 % (ref 37.5–51.0)
Hemoglobin: 16.7 g/dL (ref 13.0–17.7)
MCH: 29.9 pg (ref 26.6–33.0)
MCHC: 33 g/dL (ref 31.5–35.7)
MCV: 91 fL (ref 79–97)
Platelets: 266 10*3/uL (ref 150–450)
RBC: 5.59 x10E6/uL (ref 4.14–5.80)
RDW: 12.8 % (ref 11.6–15.4)
WBC: 10.1 10*3/uL (ref 3.4–10.8)

## 2019-07-29 NOTE — Telephone Encounter (Signed)
-----   Message from Rise Mu, PA-C sent at 07/29/2019  7:21 AM EDT ----- Renal function is consistent with prior admission level and remains mildly elevated Potassium is high normal Blood count is normal Given his Estimated Creatinine Clearance: 50.1 mL/min (A) (by C-G formula based on SCr of 1.41 mg/dL (H)), he can remain on current dose Xarelto Please make sure he is not adding salt substitutes that are high in potassium to food Not currently on diuretic With bump in renal function and higher potassium, recommend he discontinue lisinopril Recheck BMP 1 week

## 2019-07-29 NOTE — Telephone Encounter (Signed)
This has been done in another encounter.

## 2019-07-29 NOTE — Telephone Encounter (Signed)
Called patient back and gave him the results and recommendations. He did not seem to be completely understanding or would remember these instructions. Asked if I could give them to his wife and he said that was fine.  Wife verbalized understanding of results, to stop lisinopril, avoid salt substitutes, continue remaining medications and to go to [place] in 1 week for lab work.

## 2019-07-29 NOTE — Telephone Encounter (Signed)
Please call with lab results 

## 2019-07-29 NOTE — Telephone Encounter (Signed)
Wife answered, but no DPR. Patient out walking and she said she will let him know to call us when he gets back.

## 2019-08-05 ENCOUNTER — Other Ambulatory Visit
Admission: RE | Admit: 2019-08-05 | Discharge: 2019-08-05 | Disposition: A | Discharge status: Home / Self Care | Payer: PPO | Attending: Physician Assistant | Admitting: Physician Assistant

## 2019-08-05 DIAGNOSIS — Z79899 Other long term (current) drug therapy: Secondary | ICD-10-CM | POA: Insufficient documentation

## 2019-08-05 DIAGNOSIS — I10 Essential (primary) hypertension: Secondary | ICD-10-CM

## 2019-08-05 LAB — BASIC METABOLIC PANEL
Anion gap: 12 (ref 5–15)
BUN: 13 mg/dL (ref 8–23)
CO2: 24 mmol/L (ref 22–32)
Calcium: 9.5 mg/dL (ref 8.9–10.3)
Chloride: 101 mmol/L (ref 98–111)
Creatinine, Ser: 1.35 mg/dL — ABNORMAL HIGH (ref 0.61–1.24)
GFR calc Af Amer: 60 mL/min — ABNORMAL LOW (ref >60)
GFR calc non Af Amer: 51 mL/min — ABNORMAL LOW (ref >60)
Glucose, Bld: 110 mg/dL — ABNORMAL HIGH (ref 70–99)
Potassium: 4 mmol/L (ref 3.5–5.1)
Sodium: 137 mmol/L (ref 135–145)

## 2019-08-11 ENCOUNTER — Other Ambulatory Visit: Payer: Self-pay | Admitting: Internal Medicine

## 2019-08-11 MED ORDER — RIVAROXABAN 20 MG PO TABS: 20.0000 mg | ORAL_TABLET | Freq: Every day | ORAL | 1 refills | Status: DC

## 2019-08-11 MED ORDER — ATORVASTATIN CALCIUM 40 MG PO TABS: 40.0000 mg | ORAL_TABLET | Freq: Every day | ORAL | 1 refills | Status: DC

## 2019-08-11 NOTE — Telephone Encounter (Signed)
°*  STAT* If patient is at the pharmacy, call can be transferred to refill team.   1. Which medications need to be refilled? (please list name of each medication and dose if known) atorvastatin 40 Mg 1 tablet daily  Xarelto 20 MG 1 tablet with supper    2. Which pharmacy/location (including street and city if local pharmacy) is medication to be sent to? [facility] in [place]   3. Do they need a 30 day or 90 day supply? 30 day

## 2019-08-29 ENCOUNTER — Other Ambulatory Visit: Payer: Self-pay

## 2019-08-29 ENCOUNTER — Encounter: Payer: Self-pay | Admitting: Internal Medicine

## 2019-08-29 ENCOUNTER — Ambulatory Visit (INDEPENDENT_AMBULATORY_CARE_PROVIDER_SITE_OTHER): Payer: PPO | Admitting: Internal Medicine

## 2019-08-29 VITALS — BP 90/62 | HR 49 | Ht 72.0 in | Wt 173.8 lb

## 2019-08-29 DIAGNOSIS — I4819 Other persistent atrial fibrillation: Secondary | ICD-10-CM

## 2019-08-29 DIAGNOSIS — Z79899 Other long term (current) drug therapy: Secondary | ICD-10-CM

## 2019-08-29 DIAGNOSIS — I5022 Chronic systolic (congestive) heart failure: Secondary | ICD-10-CM | POA: Diagnosis not present

## 2019-08-29 DIAGNOSIS — R55 Syncope and collapse: Secondary | ICD-10-CM | POA: Diagnosis not present

## 2019-08-29 DIAGNOSIS — I2510 Atherosclerotic heart disease of native coronary artery without angina pectoris: Secondary | ICD-10-CM

## 2019-08-29 DIAGNOSIS — E785 Hyperlipidemia, unspecified: Secondary | ICD-10-CM

## 2019-08-29 NOTE — Progress Notes (Signed)
Follow-up Outpatient Visit Date: 08/29/2019  Primary Care Provider: Toni Arthurs, NP [place] [address]  Chief Complaint: Follow-up CAD, cardiomyopathy, and PAF  HPI:  Mr. Danny Gilmore is a 73 y.o. year-old male with history of NSTEMI (06/2019) that was managed medically, atrial fibrillation (diagnosed 06/2019), heart failure with reduced ejection fraction, hypertension, hyperlipidemia, hyperglycemia, and questionable history of dementia, who presents for follow-up of coronary artery disease.  He was last seen in our office by Christell Faith, PA, in early September.  He had initially presented to [facility] in late August after a syncopal episode while at [facility].  He was found to be patient with rapid ventricular response upon arrival at [facility].  Mild renal insufficiency was also noted.  Echo showed LVEF of 40-45% with global hypokinesis and suggestion of more significant focal inferior/inferolateral hypokinesis.  Cardiac catheterization was discussed but ultimately deferred given comorbidities, including concerns about Danny Gilmore mental status as well as ability to comply with treatment should PCI be performed.  He was diagnosed with vascular dementia by his PCP following his hospitalization.  At his follow-up visit, he reported feeling well without chest pain, shortness of breath, palpitations, and edema.  He had not had any further syncope nor falls.  He reported being compliant with his medications but stated that he did not wish to continue taking medications.  He also declined catheterization.  He was noted to be in atrial fibrillation with reasonable rate control.  Today, Danny Gilmore is without complaints.  He denies chest pain, shortness of breath, palpitations, lightheadedness, and edema.  His wife notes that his blood pressure has been running low at home, though Danny Gilmore is without symptoms.  He has been compliant with his medications.  He would like to begin driving again.   --------------------------------------------------------------------------------------------------  Past Medical History:  Diagnosis Date  . HFrEF (heart failure with reduced ejection fraction) (HCC)    a. EF 40-45%, diffuse HK w/ images suggestive of mod HK of the inf & inferolat wall, normal RVSF and cavity size, RVSP 32.8 mmHg, mod MR, small pericardial effusion  . Hyperlipidemia   . Hypertension   . NSTEMI (non-ST elevated myocardial infarction) ([place])   . Paroxysmal atrial fibrillation ([place])    a. diagnosed 06/2019; b. CHADS2VASc 4 (CHF, HTN, age x 1, vascular dz); c. Xarelto  . Syncope and collapse   . Tobacco use    History reviewed. No pertinent surgical history.  Current Meds  Medication Sig  . atorvastatin (LIPITOR) 40 MG tablet Take 1 tablet (40 mg total) by mouth daily.  . metoprolol tartrate (LOPRESSOR) 25 MG tablet Take 0.5 tablets (12.5 mg total) by mouth 2 (two) times daily.  . rivaroxaban (XARELTO) 20 MG TABS tablet Take 1 tablet (20 mg total) by mouth daily with supper.    Allergies: Patient has no allergy information on record.  Social History   Tobacco Use  . Smoking status: Current Every Day Smoker    Packs/day: 0.50    Years: 50.00    Pack years: 25.00    Types: Cigarettes  . Smokeless tobacco: Never Used  Substance Use Topics  . Alcohol use: Not Currently    Frequency: Never  . Drug use: Not Currently    Family History  Problem Relation Age of Onset  . Stroke Mother   . Cancer Father     Review of Systems: A 12-system review of systems was performed and was negative except as noted in the HPI.  --------------------------------------------------------------------------------------------------  Physical Exam: BP 90/62 (BP Location: Left Arm, Patient Position: Sitting, Cuff Size: Normal)   Pulse (!) 49   Ht 6' (1.829 m)   Wt 173 lb 12 oz (78.8 kg)   SpO2 98%   BMI 23.56 kg/m   General: NAD. HEENT: No conjunctival pallor or scleral  icterus.  Facemask in place. Neck: Supple without lymphadenopathy, thyromegaly, JVD, or HJR. Lungs: Normal work of breathing.  Mildly diminished breath sounds throughout without wheezes or crackles. Heart: Bradycardic but regular without murmurs. Abd: Bowel sounds present. Soft, NT/ND without hepatosplenomegaly Ext: No lower extremity edema. Radial, PT, and DP pulses are 2+ bilaterally. Skin: Warm and dry without rash.  EKG: Sinus bradycardia with left anterior fascicular block lateral T wave inversions, and possible inferior/posterior infarct.  Lab Results  Component Value Date   WBC 10.1 07/28/2019   HGB 16.7 07/28/2019   HCT 50.6 07/28/2019   MCV 91 07/28/2019   PLT 266 07/28/2019    Lab Results  Component Value Date   NA 137 08/05/2019   K 4.0 08/05/2019   CL 101 08/05/2019   CO2 24 08/05/2019   BUN 13 08/05/2019   CREATININE 1.35 (H) 08/05/2019   GLUCOSE 110 (H) 08/05/2019   ALT 25 07/13/2019    Lab Results  Component Value Date   CHOL 194 07/12/2019   HDL 30 (L) 07/12/2019   LDLCALC 139 (H) 07/12/2019   TRIG 127 07/12/2019   CHOLHDL 6.5 07/12/2019    --------------------------------------------------------------------------------------------------  ASSESSMENT AND PLAN: Coronary artery disease: Patient without symptoms but presumed CAD based on elevated troponin in the setting of syncope and atrial fibrillation.  He has previously declined catheterization and again is reluctant to proceed.  Given his dementia, I think this is reasonable.  In lieu of aspirin, we will continue with rivaroxaban.  Persistent atrial fibrillation: Patient has converted back to sinus bradycardia since his visit with Eula Listen, PA, last month.  Heart rate is borderline low at 49 bpm albeit without symptoms.  Given history of atrial fibrillation with rapid ventricular response in the past, we have agreed to continue with metoprolol tartrate 12.5 mg twice daily unless his heart rate/blood  pressure were to drop further or symptoms were to develop.  Danny Gilmore will continue on rivaroxaban 20 mg daily.  Chronic HFrEF: Danny Gilmore appears euvolemic with NYHA class I-II symptoms.  Low blood pressure and heart rate preclude escalation of evidence-based heart failure therapy.  No role for diuretic at this time.  Syncope: No further episodes reported.  Because for his episode in August remains unclear but is likely multifactorial.  I have advised him that he should continue to refrain from driving for at least 6 months from the time of his event.  Vascular dementia: Diagnosis made following hospitalization this summer.  Continue with secondary prevention, including anticoagulation in the setting of atrial fibrillation and statin therapy.  Hyperlipidemia: In the setting of NSTEMI, goal LDL is less than 70.  Continue atorvastatin with CMP and lipid panel in mid/late November.  Follow-up: Return to clinic in 3 months.  Yvonne Kendall, MD 08/29/2019 11:03 AM

## 2019-08-29 NOTE — Patient Instructions (Signed)
Medication Instructions:  Your physician recommends that you continue on your current medications as directed. Please refer to the Current Medication list given to you today.  If you need a refill on your cardiac medications before your next appointment, please call your pharmacy.   Lab work: Your physician recommends that you return for lab work in: about 1 month (mid to end of November). - You will need to be FASTING. Checking LIPID, CMP. - Please go to the [facility] You will check in at the front desk to the right as you walk into the atrium. Valet Parking is offered if needed. - No appointment needed. You may go any day between 7 am and 6 pm.  If you have labs (blood work) drawn today and your tests are completely normal, you will receive your results only by: Marland Kitchen MyChart Message (if you have MyChart) OR . A paper copy in the mail If you have any lab test that is abnormal or we need to change your treatment, we will call you to review the results.  Testing/Procedures: NONE  Follow-Up: At [facility], you and your health needs are our priority.  As part of our continuing mission to provide you with exceptional heart care, we have created designated Provider Care Teams.  These Care Teams include your primary Cardiologist (physician) and Advanced Practice Providers (APPs -  Physician Assistants and Nurse Practitioners) who all work together to provide you with the care you need, when you need it. You will need a follow up appointment in 3 months.  Please call our office 2 months in advance to schedule this appointment.  You may see Nelva Bush, MD or one of the following Advanced Practice Providers on your designated Care Team:   Brozek Hodgkins, NP Christell Faith, PA-C . Marrianne Mood, PA-C

## 2019-08-30 DIAGNOSIS — I214 Non-ST elevation (NSTEMI) myocardial infarction: Secondary | ICD-10-CM | POA: Diagnosis not present

## 2019-08-30 DIAGNOSIS — E538 Deficiency of other specified B group vitamins: Secondary | ICD-10-CM | POA: Diagnosis not present

## 2019-08-30 DIAGNOSIS — I480 Paroxysmal atrial fibrillation: Secondary | ICD-10-CM | POA: Diagnosis not present

## 2019-08-30 DIAGNOSIS — E559 Vitamin D deficiency, unspecified: Secondary | ICD-10-CM | POA: Diagnosis not present

## 2019-08-30 DIAGNOSIS — Z7189 Other specified counseling: Secondary | ICD-10-CM | POA: Diagnosis not present

## 2019-08-30 DIAGNOSIS — Z0000 Encounter for general adult medical examination without abnormal findings: Secondary | ICD-10-CM | POA: Diagnosis not present

## 2019-08-30 DIAGNOSIS — F0150 Vascular dementia without behavioral disturbance: Secondary | ICD-10-CM | POA: Diagnosis not present

## 2019-09-03 ENCOUNTER — Other Ambulatory Visit: Payer: Self-pay | Admitting: Internal Medicine

## 2019-09-05 NOTE — Telephone Encounter (Signed)
Please review for refill. Thanks!  

## 2019-09-05 NOTE — Telephone Encounter (Signed)
Pt's age 73, wt 78.8 kg, SCr 1.35, CrCl 53.51, last ov w/ CE 08/29/19.

## 2019-09-29 DIAGNOSIS — I480 Paroxysmal atrial fibrillation: Secondary | ICD-10-CM | POA: Diagnosis not present

## 2019-09-29 DIAGNOSIS — I214 Non-ST elevation (NSTEMI) myocardial infarction: Secondary | ICD-10-CM | POA: Diagnosis not present

## 2019-09-29 DIAGNOSIS — E538 Deficiency of other specified B group vitamins: Secondary | ICD-10-CM | POA: Diagnosis not present

## 2019-09-29 DIAGNOSIS — I950 Idiopathic hypotension: Secondary | ICD-10-CM | POA: Diagnosis not present

## 2019-09-29 DIAGNOSIS — F0150 Vascular dementia without behavioral disturbance: Secondary | ICD-10-CM | POA: Diagnosis not present

## 2019-09-29 DIAGNOSIS — E559 Vitamin D deficiency, unspecified: Secondary | ICD-10-CM | POA: Diagnosis not present

## 2019-10-05 ENCOUNTER — Telehealth: Payer: Self-pay | Admitting: *Deleted

## 2019-10-05 ENCOUNTER — Encounter: Payer: Self-pay | Admitting: *Deleted

## 2019-10-05 NOTE — Telephone Encounter (Signed)
Patient due for lab work. Reminder letter mailed.

## 2019-10-25 ENCOUNTER — Other Ambulatory Visit
Admission: RE | Admit: 2019-10-25 | Discharge: 2019-10-25 | Disposition: A | Discharge status: Home / Self Care | Payer: PPO | Source: Ambulatory Visit | Attending: Internal Medicine | Admitting: Internal Medicine

## 2019-10-25 ENCOUNTER — Other Ambulatory Visit: Payer: Self-pay

## 2019-10-25 DIAGNOSIS — E785 Hyperlipidemia, unspecified: Secondary | ICD-10-CM | POA: Diagnosis not present

## 2019-10-25 DIAGNOSIS — Z79899 Other long term (current) drug therapy: Secondary | ICD-10-CM | POA: Diagnosis not present

## 2019-10-25 LAB — LIPID PANEL
Cholesterol: 126 mg/dL (ref 0–200)
HDL: 31 mg/dL — ABNORMAL LOW (ref >40)
LDL Cholesterol: 67 mg/dL (ref 0–99)
Total CHOL/HDL Ratio: 4.1 RATIO
Triglycerides: 139 mg/dL (ref <150)
VLDL: 28 mg/dL (ref 0–40)

## 2019-10-25 LAB — COMPREHENSIVE METABOLIC PANEL
ALT: 44 U/L (ref 0–44)
AST: 37 U/L (ref 15–41)
Albumin: 4.4 g/dL (ref 3.5–5.0)
Alkaline Phosphatase: 85 U/L (ref 38–126)
Anion gap: 10 (ref 5–15)
BUN: 16 mg/dL (ref 8–23)
CO2: 27 mmol/L (ref 22–32)
Calcium: 9 mg/dL (ref 8.9–10.3)
Chloride: 99 mmol/L (ref 98–111)
Creatinine, Ser: 1.44 mg/dL — ABNORMAL HIGH (ref 0.61–1.24)
GFR calc Af Amer: 55 mL/min — ABNORMAL LOW (ref >60)
GFR calc non Af Amer: 47 mL/min — ABNORMAL LOW (ref >60)
Glucose, Bld: 94 mg/dL (ref 70–99)
Potassium: 3.5 mmol/L (ref 3.5–5.1)
Sodium: 136 mmol/L (ref 135–145)
Total Bilirubin: 1.1 mg/dL (ref 0.3–1.2)
Total Protein: 7.2 g/dL (ref 6.5–8.1)

## 2019-12-02 ENCOUNTER — Ambulatory Visit (INDEPENDENT_AMBULATORY_CARE_PROVIDER_SITE_OTHER): Payer: PPO | Admitting: Family

## 2019-12-02 ENCOUNTER — Encounter: Payer: Self-pay | Admitting: Family

## 2019-12-02 ENCOUNTER — Other Ambulatory Visit: Payer: Self-pay

## 2019-12-02 VITALS — BP 92/58 | HR 52 | Ht 73.0 in | Wt 178.0 lb

## 2019-12-02 DIAGNOSIS — I2510 Atherosclerotic heart disease of native coronary artery without angina pectoris: Secondary | ICD-10-CM

## 2019-12-02 MED ORDER — ATORVASTATIN CALCIUM 40 MG PO TABS: 40.0000 mg | ORAL_TABLET | Freq: Every day | ORAL | 3 refills | Status: DC

## 2019-12-02 MED ORDER — METOPROLOL TARTRATE 25 MG PO TABS: 12.5000 mg | ORAL_TABLET | Freq: Two times a day (BID) | ORAL | 3 refills | Status: DC

## 2019-12-02 MED ORDER — RIVAROXABAN 20 MG PO TABS: 20.0000 mg | ORAL_TABLET | Freq: Every day | ORAL | 3 refills | Status: DC

## 2019-12-02 NOTE — Patient Instructions (Addendum)
Medication Instructions:  No medication changes today.  *If you need a refill on your cardiac medications before your next appointment, please call your pharmacy*  Lab Work: None ordered today.    Testing/Procedures: You had an EKG today. This showed sinus bradycardia. This is a stable finding for you. It did not show atrial fibrillation (the irregular heart rate you have had in the past)  Follow-Up: At [facility], you and your health needs are our priority.  As part of our continuing mission to provide you with exceptional heart care, we have created designated Provider Care Teams.  These Care Teams include your primary Cardiologist (physician) and Advanced Practice Providers (APPs -  Physician Assistants and Nurse Practitioners) who all work together to provide you with the care you need, when you need it.  Your next appointment:   3 month(s)  The format for your next appointment:   In Person  Provider:    You may see Yvonne Kendall, MD or one of the following Advanced Practice Providers on your designated Care Team:    Nicolasa Ducking, NP  Eula Listen, PA-C  Marisue Ivan, PA-C   Other Instructions   Please report and new chest pain, pressure, or tightness.   Please report any new lightheadedness, dizziness, almost passing out, or passing out.   Please call us if you notice blood in your urine or blood in your stool.

## 2019-12-02 NOTE — Progress Notes (Signed)
Office Visit    Patient Name: Danny Gilmore Date of Encounter: 12/02/2019  Primary Care Provider:  Toni Arthurs, NP Primary Cardiologist:  Nelva Bush, MD Electrophysiologist:  None   Chief Complaint    Danny Gilmore is a 74 y.o. male with a hx of CAD, atrial fibrillation, HFrEF, HTN, HLD, hyperglycemia, questionable history of dementia presents today for 57-month follow-up of CAD with NSTEMI 06/2019 managed medically, atrial fibrillation (diagnosed 06/2019), HFrEF, HTN, HLD, hyperglycemia, vascular dementia. Presents today for follow-up of coronary artery disease.    Past Medical History    Past Medical History:  Diagnosis Date  . HFrEF (heart failure with reduced ejection fraction) (HCC)    a. EF 40-45%, diffuse HK w/ images suggestive of mod HK of the inf & inferolat wall, normal RVSF and cavity size, RVSP 32.8 mmHg, mod MR, small pericardial effusion  . Hyperlipidemia   . Hypertension   . NSTEMI (non-ST elevated myocardial infarction) ([place])   . Paroxysmal atrial fibrillation ([place])    a. diagnosed 06/2019; b. CHADS2VASc 4 (CHF, HTN, age x 1, vascular dz); c. Xarelto  . Syncope and collapse   . Tobacco use    History reviewed. No pertinent surgical history.  Allergies  No Known Allergies  History of Present Illness    Danny Gilmore is a 74 y.o. male with a hx of CAD, atrial fibrillation, HFrEF, HTN, HLD, hyperglycemia, questionable history of dementia presents today for 67-month follow-up of CAD with NSTEMI 06/2019 managed medically, atrial fibrillation (diagnosed 06/2019), HFrEF, HTN, HLD, hyperglycemia, vascular dementia. Presents today for follow-up of coronary artery disease. Last seen by Dr. Saunders Revel 08/29/2019.  Presented to [facility] late August after syncopal episode. Found to be in atrial fibrillation with RVR. Mild renal insufficiency noted. Echo LVEF 40 to 45% with global hypokinesis and suggestion of more significant focal inferior/inferolateral hypokinesis. Cardiac  catheterization was deferred due to comorbidities. Diagnosed with vascular dementia by his PCP following this hospitalization.   He reports feeling well. Enjoys fishing in his spare time. Uses a wood burning stove at home and does chop wood. No formal exercise regimen. Tells me he and his wife cook together at home.  Denies chest pain, pressure, tightness. Reports no SOB nor DOE. Reports no lightheadedness, dizziness, near-syncope, syncope.   EKGs/Labs/Other Studies Reviewed:   The following studies were reviewed today:  Echo 06/2019 . The left ventricle has mild-moderately reduced systolic function, with an ejection fraction of 40-45%. The cavity size was mildly dilated. Left ventricular with mild diffuse hypokinesis, images suggestive of moderate hypokinesis of the inferior and  inferolateral wall.  2. The right ventricle has normal systolic function. The cavity was normal. There is no increase in right ventricular wall thickness. Right ventricular systolic pressure is mildly elevated with an estimated pressure of 32.8 mmHg.  3. Mitral valve regurgitation is moderate  4. Small pericardial effusion.    EKG:  EKG is ordered today.  The ekg ordered today demonstrates SR rate 52 bpm with PVC.   Recent Labs: 07/12/2019: Magnesium 2.3; TSH 2.338 07/28/2019: Hemoglobin 16.7; Platelets 266 10/25/2019: ALT 44; BUN 16; Creatinine, Ser 1.44; Potassium 3.5; Sodium 136  Recent Lipid Panel    Component Value Date/Time   CHOL 126 10/25/2019 0955   TRIG 139 10/25/2019 0955   HDL 31 (L) 10/25/2019 0955   CHOLHDL 4.1 10/25/2019 0955   VLDL 28 10/25/2019 0955   LDLCALC 67 10/25/2019 0955    Home Medications   Current Meds  Medication Sig  . memantine (NAMENDA) 10 MG tablet Take 10 mg by mouth 2 (two) times daily.  . rivaroxaban (XARELTO) 20 MG TABS tablet Take 1 tablet (20 mg total) by mouth daily with supper.  . [DISCONTINUED] rivaroxaban (XARELTO) 20 MG TABS tablet Take 1 tablet (20 mg total)  by mouth daily with supper.    Review of Systems    Review of Systems  Constitution: Negative for chills, fever and malaise/fatigue.  Cardiovascular: Negative for chest pain, dyspnea on exertion, irregular heartbeat, leg swelling, near-syncope, orthopnea, palpitations and syncope.  Respiratory: Negative for cough, shortness of breath and wheezing.   Gastrointestinal: Negative for melena, nausea and vomiting.  Genitourinary: Negative for hematuria.  Neurological: Negative for dizziness, light-headedness and weakness.   All other systems reviewed and are otherwise negative except as noted above.  Physical Exam    VS:  BP (!) 92/58   Pulse (!) 52   Ht 6\' 1"  (1.854 m)   Wt 178 lb (80.7 kg)   BMI 23.48 kg/m  , BMI Body mass index is 23.48 kg/m. GEN: Well nourished, thin, well developed, in no acute distress. HEENT: normal. Neck: Supple, no JVD, carotid bruits, or masses. Cardiac: RRR, no murmurs, rubs, or gallops. No clubbing, cyanosis, edema.  Radials/PT 2+ and equal bilaterally.  Respiratory:  Respirations regular and unlabored, clear to auscultation bilaterally. GI: Soft, nontender, nondistended, BS + x 4. MS: No deformity or atrophy. Skin: Warm and dry, no rash. Neuro:  Strength and sensation are intact. Psych: Normal affect.  Accessory Clinical Findings    ECG personally reviewed by me today - SB 52 bpm with PVC,stable t wave inversion in V6 - no acute changes.  Assessment & Plan    1. CAD - s/p NSTEMI 06/2019 treated medically. Stable with no anginal symptoms. No indication for ischemic evaluation at this time. Continue GDMT beta blocker, statin. No aspirin secondary to chronic anticoagulation. Encouraged low sodium, heart healthy diet. Encourage regular cardiovascular exercise.   2. Persistent atrial fibrillation - Maintaining SB. Denies palpitations.  Continue Metoprolol 12.5mg  BID and Xarelto.   3. Chronic anticoagulation - Secondary to atrial fibrillation. Denies  bleeding complications. Continue Xarelto 20mg  daily.   4. HFrEF - Echo 06/2019 LVEF 40-45%. Low BP and HR have limited escalation of GDMT. He is euvolemic and well compensated on exam today. NYHA I. Continue beta blocker.   5. Hypotension/bradycardia - These are asymptomatic. No lightheadedness, dizziness, near-syncope, syncope. Will continue Metoprolol to avoid recurrence of atrial fibrillation and for GDMT of CAD/HFrEF.  6. Syncope - No recurrence. Pt educated to report recurrent syncope.   7. Vascular dementia - Follows with PCP. Presently on Namenda.  8. HLD, LDL goal <70 - 10/2019 LDL 67 at goal. Continue Atorvastatin 40mg  daily.   9. Tobacco abuse - Not interested in quitting at this time. Smoking cessation encouraged. Recommend utilization of 1800QUITNOW.  Disposition: Follow up in 3 month(s) with Dr. 07/2019 or APP   11/2019, NP 12/02/2019, 10:26 AM

## 2020-03-02 ENCOUNTER — Other Ambulatory Visit: Payer: Self-pay

## 2020-03-02 ENCOUNTER — Encounter: Payer: Self-pay | Admitting: Internal Medicine

## 2020-03-02 ENCOUNTER — Ambulatory Visit (INDEPENDENT_AMBULATORY_CARE_PROVIDER_SITE_OTHER): Payer: PPO | Admitting: Internal Medicine

## 2020-03-02 VITALS — BP 98/68 | HR 56 | Ht 74.0 in | Wt 179.4 lb

## 2020-03-02 DIAGNOSIS — I5022 Chronic systolic (congestive) heart failure: Secondary | ICD-10-CM | POA: Diagnosis not present

## 2020-03-02 DIAGNOSIS — Z720 Tobacco use: Secondary | ICD-10-CM | POA: Insufficient documentation

## 2020-03-02 DIAGNOSIS — I4819 Other persistent atrial fibrillation: Secondary | ICD-10-CM

## 2020-03-02 DIAGNOSIS — I2510 Atherosclerotic heart disease of native coronary artery without angina pectoris: Secondary | ICD-10-CM | POA: Diagnosis not present

## 2020-03-02 NOTE — Patient Instructions (Signed)
Medication Instructions:  Your physician recommends that you continue on your current medications as directed. Please refer to the Current Medication list given to you today.  *If you need a refill on your cardiac medications before your next appointment, please call your pharmacy*   Lab Work: None ordered If you have labs (blood work) drawn today and your tests are completely normal, you will receive your results only by: Marland Kitchen MyChart Message (if you have MyChart) OR . A paper copy in the mail If you have any lab test that is abnormal or we need to change your treatment, we will call you to review the results.   Testing/Procedures: None ordered   Follow-Up: At [facility], you and your health needs are our priority.  As part of our continuing mission to provide you with exceptional heart care, we have created designated Provider Care Teams.  These Care Teams include your primary Cardiologist (physician) and Advanced Practice Providers (APPs -  Physician Assistants and Nurse Practitioners) who all work together to provide you with the care you need, when you need it.  We recommend signing up for the patient portal called "MyChart".  Sign up information is provided on this After Visit Summary.  MyChart is used to connect with patients for Virtual Visits (Telemedicine).  Patients are able to view lab/test results, encounter notes, upcoming appointments, etc.  Non-urgent messages can be sent to your provider as well.   To learn more about what you can do with MyChart, go to [email or web address].    Your next appointment:   6 month(s)  The format for your next appointment:   In Person  Provider:    You may see Yvonne Kendall, MD or one of the following Advanced Practice Providers on your designated Care Team:    Nicolasa Ducking, NP  Eula Listen, PA-C  Marisue Ivan, PA-C    Other Instructions Patients wife should attend all office appointments.

## 2020-03-02 NOTE — Progress Notes (Signed)
Follow-up Outpatient Visit Date: 03/02/2020  Primary Care Provider: Toni Arthurs, NP [place] [address]  Chief Complaint: Follow-up cardiomyopathy and atrial fibrillation  HPI:  Mr. Glaze is a 74 y.o. male with history of NSTEMI (06/2019) that was managed medically without coronary angiography due to comorbidities, persistent atrial fibrillation (diagnosed 06/2019), heart failure with reduced ejection fraction, hypertension, hyperlipidemia, hyperglycemia, and questionable history of dementia, who presents for follow-up of coronary artery disease, cardiomyopathy, and paroxysmal atrial fibrillation.  He was last seen in our office by Laurann Montana, NP, in mid January.  At that time, he was feeling well and able to do strenuous activities such as chopping wood without limitations.  Today, Mr. Rennert presents without his wife.  He does not have any complaints, denying chest pain, shortness of breath, palpitations, lightheadedness, and edema.  He notes that his home blood pressures are typically similar to today's reading.  He has not had any bleeding.  He continues to be active at home, performing tasks around the house and going fishing without any limitations.  --------------------------------------------------------------------------------------------------  Past Medical History:  Diagnosis Date  . HFrEF (heart failure with reduced ejection fraction) (HCC)    a. EF 40-45%, diffuse HK w/ images suggestive of mod HK of the inf & inferolat wall, normal RVSF and cavity size, RVSP 32.8 mmHg, mod MR, small pericardial effusion  . Hyperlipidemia   . Hypertension   . NSTEMI (non-ST elevated myocardial infarction) ([place])   . Paroxysmal atrial fibrillation ([place])    a. diagnosed 06/2019; b. CHADS2VASc 4 (CHF, HTN, age x 1, vascular dz); c. Xarelto  . Syncope and collapse   . Tobacco use    No past surgical history on file.   Recent CV Pertinent Labs: Lab Results    Component Value Date   CHOL 126 10/25/2019   HDL 31 (L) 10/25/2019   LDLCALC 67 10/25/2019   TRIG 139 10/25/2019   CHOLHDL 4.1 10/25/2019   INR 1.0 07/12/2019   K 3.5 10/25/2019   MG 2.3 07/12/2019   BUN 16 10/25/2019   BUN 15 07/28/2019   CREATININE 1.44 (H) 10/25/2019    Past medical and surgical history were reviewed and updated in EPIC.  Current Meds  Medication Sig  . atorvastatin (LIPITOR) 40 MG tablet Take 1 tablet (40 mg total) by mouth daily.  . memantine (NAMENDA) 10 MG tablet Take 10 mg by mouth 2 (two) times daily.  . metoprolol tartrate (LOPRESSOR) 25 MG tablet Take 0.5 tablets (12.5 mg total) by mouth 2 (two) times daily.  . rivaroxaban (XARELTO) 20 MG TABS tablet Take 1 tablet (20 mg total) by mouth daily with supper.    Allergies: Other  Social History   Tobacco Use  . Smoking status: Current Every Day Smoker    Packs/day: 0.50    Years: 50.00    Pack years: 25.00    Types: Cigarettes  . Smokeless tobacco: Never Used  Substance Use Topics  . Alcohol use: Not Currently  . Drug use: Not Currently    Family History  Problem Relation Age of Onset  . Stroke Mother   . Cancer Father     Review of Systems: A 12-system review of systems was performed and was negative except as noted in the HPI.  --------------------------------------------------------------------------------------------------  Physical Exam: BP 98/68 (BP Location: Left Arm, Patient Position: Sitting, Cuff Size: Normal)   Pulse (!) 56   Ht 6\' 2"  (1.88 m)   Wt 179 lb 6  oz (81.4 kg)   SpO2 98%   BMI 23.03 kg/m   General: NAD. Neck: No JVD. Lungs: Mildly diminished breath sounds throughout without wheezes or crackles. Heart: Bradycardic but regular rhythm with occasional extrasystoles.  No murmurs, rubs, or gallops. Abdomen: Soft, nontender, nondistended. Extremities: No lower extremity edema.  EKG: Sinus bradycardia with PVCs, low voltage, left axis deviation, and  inferior/posterior MI.  No significant change since 12/02/2019.  Lab Results  Component Value Date   WBC 10.1 07/28/2019   HGB 16.7 07/28/2019   HCT 50.6 07/28/2019   MCV 91 07/28/2019   PLT 266 07/28/2019    Lab Results  Component Value Date   NA 136 10/25/2019   K 3.5 10/25/2019   CL 99 10/25/2019   CO2 27 10/25/2019   BUN 16 10/25/2019   CREATININE 1.44 (H) 10/25/2019   GLUCOSE 94 10/25/2019   ALT 44 10/25/2019    Lab Results  Component Value Date   CHOL 126 10/25/2019   HDL 31 (L) 10/25/2019   LDLCALC 67 10/25/2019   TRIG 139 10/25/2019   CHOLHDL 4.1 10/25/2019    --------------------------------------------------------------------------------------------------  ASSESSMENT AND PLAN: Chronic HFrEF: Mr. Molyneux appears euvolemic and well compensated without any symptoms.  We will continue current medications; I am unable to escalate his current dose of metoprolol or add an ACE inhibitor/ARB due to soft blood pressure.  Coronary artery disease: Mr. Serano has presumed CAD given hospitalization with NSTEMI and cardiomyopathy as well as EKG findings consistent with inferior/posterior MI.  Previously, ischemia evaluation has been deferred due to comorbidities and lack of symptoms.  I think it is reasonable to continue with this plan.  We will continue with atorvastatin and metoprolol for secondary prevention.  LDL is at goal.  He is not on aspirin secondary to anticoagulation with rivaroxaban.  Persistent atrial fibrillation: Mr. Gali is in sinus rhythm today.  Though he continues to have mild bradycardia, he is asymptomatic.  We will therefore continue his current dose of metoprolol as well as indefinite anticoagulation with rivaroxaban.  Tobacco use: Mr. Olveda continues to smoke about 1/2 pack/day and is not interested in quitting.  Follow-up: Return to clinic in 6 months.  Yvonne Kendall, MD 03/02/2020 10:16 AM

## 2020-03-28 DIAGNOSIS — Z79899 Other long term (current) drug therapy: Secondary | ICD-10-CM | POA: Diagnosis not present

## 2020-03-28 DIAGNOSIS — Z7189 Other specified counseling: Secondary | ICD-10-CM | POA: Diagnosis not present

## 2020-03-28 DIAGNOSIS — E559 Vitamin D deficiency, unspecified: Secondary | ICD-10-CM | POA: Diagnosis not present

## 2020-03-28 DIAGNOSIS — F0150 Vascular dementia without behavioral disturbance: Secondary | ICD-10-CM | POA: Diagnosis not present

## 2020-03-28 DIAGNOSIS — I950 Idiopathic hypotension: Secondary | ICD-10-CM | POA: Diagnosis not present

## 2020-03-28 DIAGNOSIS — I2510 Atherosclerotic heart disease of native coronary artery without angina pectoris: Secondary | ICD-10-CM | POA: Diagnosis not present

## 2020-03-28 DIAGNOSIS — E538 Deficiency of other specified B group vitamins: Secondary | ICD-10-CM | POA: Diagnosis not present

## 2020-03-28 DIAGNOSIS — I480 Paroxysmal atrial fibrillation: Secondary | ICD-10-CM | POA: Diagnosis not present

## 2020-09-05 ENCOUNTER — Other Ambulatory Visit: Payer: Self-pay

## 2020-09-05 ENCOUNTER — Ambulatory Visit: Payer: PPO | Admitting: Internal Medicine

## 2020-09-05 ENCOUNTER — Encounter: Payer: Self-pay | Admitting: Internal Medicine

## 2020-09-05 VITALS — BP 110/70 | HR 69 | Ht 74.0 in | Wt 182.0 lb

## 2020-09-05 DIAGNOSIS — I4819 Other persistent atrial fibrillation: Secondary | ICD-10-CM

## 2020-09-05 DIAGNOSIS — R059 Cough, unspecified: Secondary | ICD-10-CM | POA: Insufficient documentation

## 2020-09-05 DIAGNOSIS — I255 Ischemic cardiomyopathy: Secondary | ICD-10-CM | POA: Diagnosis not present

## 2020-09-05 NOTE — Patient Instructions (Signed)
Medication Instructions:  Your physician recommends that you continue on your current medications as directed. Please refer to the Current Medication list given to you today.  *If you need a refill on your cardiac medications before your next appointment, please call your pharmacy*  Follow-Up: At [facility], you and your health needs are our priority.  As part of our continuing mission to provide you with exceptional heart care, we have created designated Provider Care Teams.  These Care Teams include your primary Cardiologist (physician) and Advanced Practice Providers (APPs -  Physician Assistants and Nurse Practitioners) who all work together to provide you with the care you need, when you need it.  We recommend signing up for the patient portal called "MyChart".  Sign up information is provided on this After Visit Summary.  MyChart is used to connect with patients for Virtual Visits (Telemedicine).  Patients are able to view lab/test results, encounter notes, upcoming appointments, etc.  Non-urgent messages can be sent to your provider as well.   To learn more about what you can do with MyChart, go to [email or web address].    Your next appointment:   6 month(s)  The format for your next appointment:   In Person  Provider:   You may see Christopher End, MD or one of the following Advanced Practice Providers on your designated Care Team:    Christopher Berge, NP  Ryan Dunn, PA-C  Jacquelyn Visser, PA-C  Cadence Furth, PA-C   

## 2020-09-05 NOTE — Progress Notes (Signed)
Follow-up Outpatient Visit Date: 09/05/2020  Primary Care Provider: Lorenso Quarry, NP [address] [place] [address]  Chief Complaint: Follow-up atrial fibrillation and cardiomyopathy  HPI:  Mr. Maino is a 74 y.o. male with history of NSTEMI (06/2019) that was managed medically without coronary angiography due to comorbidities, persistent atrial fibrillation (diagnosed 06/2019),heart failure with reduced ejection fraction, hypertension, hyperlipidemia, hyperglycemia, and questionable history of dementia, who presents for follow-up of CAD, cardiomyopathy, and PAF.  I last saw him in April, at which time he was without complaints.  Today, Mr. Steidle reports that he is feeling fairly well.  He is coughing frequently during our visit today and says this is relatively new though he is unable to provide specific time course.  He denies shortness of breath, chest pain, palpitations, lightheadedness, edema, and fever/chills.  Metoprolol was stopped at some point, though Mr. Brosch is unsure why.  He remains on rivaroxaban without bleeding or falls.  He continues to smoke and is not interested in quitting.Marland Kitchen  --------------------------------------------------------------------------------------------------  Past Medical History:  Diagnosis Date  . HFrEF (heart failure with reduced ejection fraction) (HCC)    a. EF 40-45%, diffuse HK w/ images suggestive of mod HK of the inf & inferolat wall, normal RVSF and cavity size, RVSP 32.8 mmHg, mod MR, small pericardial effusion  . Hyperlipidemia   . Hypertension   . NSTEMI (non-ST elevated myocardial infarction) (HCC)   . Paroxysmal atrial fibrillation (HCC)    a. diagnosed 06/2019; b. CHADS2VASc 4 (CHF, HTN, age x 1, vascular dz); c. Xarelto  . Syncope and collapse   . Tobacco use    History reviewed. No pertinent surgical history.  Current Meds  Medication Sig  . ascorbic acid (VITAMIN C) 500 MG tablet Take 500 mg by mouth daily.  Marland Kitchen  atorvastatin (LIPITOR) 40 MG tablet Take 1 tablet (40 mg total) by mouth daily.  . Cyanocobalamin (VITAMIN B-12 PO) Take by mouth.  . memantine (NAMENDA) 10 MG tablet Take 10 mg by mouth 2 (two) times daily.  . Multiple Vitamins-Minerals (VITAMIN D3 COMPLETE PO) Take by mouth.  . rivaroxaban (XARELTO) 20 MG TABS tablet Take 1 tablet (20 mg total) by mouth daily with supper.    Allergies: Other  Social History   Tobacco Use  . Smoking status: Current Every Day Smoker    Packs/day: 0.50    Years: 50.00    Pack years: 25.00    Types: Cigarettes  . Smokeless tobacco: Never Used  Substance Use Topics  . Alcohol use: Not Currently  . Drug use: Not Currently    Family History  Problem Relation Age of Onset  . Stroke Mother   . Cancer Father     Review of Systems: A 12-system review of systems was performed and was negative except as noted in the HPI.  --------------------------------------------------------------------------------------------------  Physical Exam: BP 110/70 (BP Location: Left Arm, Patient Position: Sitting, Cuff Size: Normal)   Pulse 69   Ht 6\' 2"  (1.88 m)   Wt 182 lb (82.6 kg)   SpO2 92%   BMI 23.37 kg/m   General: NAD. Lungs: Clear to auscultation without wheezes or crackles. Heart: Regular rate and rhythm with occasional extrasystoles.  No murmurs. Abdomen: Soft, nontender, nondistended. Extremities: No lower extremity edema.  EKG: Sinus rhythm with PACs and PVCs, low voltage, left anterior fascicular block, and inferior infarct.  No significant change from prior tracing on 03/02/2020.  Lab Results  Component Value Date   WBC 10.1 07/28/2019  HGB 16.7 07/28/2019   HCT 50.6 07/28/2019   MCV 91 07/28/2019   PLT 266 07/28/2019    Lab Results  Component Value Date   NA 136 10/25/2019   K 3.5 10/25/2019   CL 99 10/25/2019   CO2 27 10/25/2019   BUN 16 10/25/2019   CREATININE 1.44 (H) 10/25/2019   GLUCOSE 94 10/25/2019   ALT 44 10/25/2019     Lab Results  Component Value Date   CHOL 126 10/25/2019   HDL 31 (L) 10/25/2019   LDLCALC 67 10/25/2019   TRIG 139 10/25/2019   CHOLHDL 4.1 10/25/2019    --------------------------------------------------------------------------------------------------  ASSESSMENT AND PLAN: Persistent atrial fibrillation: Mr. Berthold is maintaining sinus rhythm albeit with frequent ventricular and supraventricular ectopy.  We will continue with indefinite anticoagulation with rivaroxaban 20 mg daily (GFR 59 on most recent labs from [facility] in 5/21).  Metoprolol has fallen off his medication list, though given low normal heart rate and blood pressure today, I think it is reasonable to defer restarting this.  Ischemic cardiomyopathy: Moderately reduced LVEF presumed to be ischemic in nature.  Mr. Punch appears euvolemic.  He again declines ischemia evaluation.  He is not on any evidence-based heart failure therapy, but given borderline low blood pressure and heart rate as well as other comorbidities, I think it is reasonable to defer restarting a beta-blocker or challenging him with an ACE inhibitor/ARB.  Cough: Mr. Rossetti reports this is fairly recent though he is unable to provide any further details.  He does not have any other associated symptoms.  I advised him to reach out to his PCP if this does not begin to improve soon for further evaluation, including COVID-19 testing.  Of note, the patient is fully vaccinated against COVID-19.  Follow-up: Return to clinic in 6 months.  Yvonne Kendall, MD 09/05/2020 10:15 AM

## 2020-10-01 DIAGNOSIS — Z0000 Encounter for general adult medical examination without abnormal findings: Secondary | ICD-10-CM | POA: Diagnosis not present

## 2020-10-01 DIAGNOSIS — R7309 Other abnormal glucose: Secondary | ICD-10-CM | POA: Diagnosis not present

## 2020-10-01 DIAGNOSIS — E538 Deficiency of other specified B group vitamins: Secondary | ICD-10-CM | POA: Diagnosis not present

## 2020-10-01 DIAGNOSIS — I5022 Chronic systolic (congestive) heart failure: Secondary | ICD-10-CM | POA: Diagnosis not present

## 2020-10-01 DIAGNOSIS — Z131 Encounter for screening for diabetes mellitus: Secondary | ICD-10-CM | POA: Diagnosis not present

## 2020-10-01 DIAGNOSIS — I2510 Atherosclerotic heart disease of native coronary artery without angina pectoris: Secondary | ICD-10-CM | POA: Diagnosis not present

## 2020-10-01 DIAGNOSIS — E559 Vitamin D deficiency, unspecified: Secondary | ICD-10-CM | POA: Diagnosis not present

## 2020-10-01 DIAGNOSIS — Z1329 Encounter for screening for other suspected endocrine disorder: Secondary | ICD-10-CM | POA: Diagnosis not present

## 2020-10-01 DIAGNOSIS — I255 Ischemic cardiomyopathy: Secondary | ICD-10-CM | POA: Diagnosis not present

## 2020-10-01 DIAGNOSIS — Z23 Encounter for immunization: Secondary | ICD-10-CM | POA: Diagnosis not present

## 2020-10-01 DIAGNOSIS — F0150 Vascular dementia without behavioral disturbance: Secondary | ICD-10-CM | POA: Diagnosis not present

## 2020-10-01 DIAGNOSIS — I950 Idiopathic hypotension: Secondary | ICD-10-CM | POA: Diagnosis not present

## 2020-10-01 DIAGNOSIS — I480 Paroxysmal atrial fibrillation: Secondary | ICD-10-CM | POA: Diagnosis not present

## 2020-10-01 DIAGNOSIS — Z13220 Encounter for screening for lipoid disorders: Secondary | ICD-10-CM | POA: Diagnosis not present

## 2020-10-01 DIAGNOSIS — Z720 Tobacco use: Secondary | ICD-10-CM | POA: Diagnosis not present

## 2020-12-09 ENCOUNTER — Other Ambulatory Visit: Payer: Self-pay | Admitting: Family

## 2020-12-10 NOTE — Telephone Encounter (Signed)
Please review for refill. Thanks!  

## 2020-12-10 NOTE — Telephone Encounter (Signed)
1m, 82.6 kg, scr 1.4 (10/01/20), ccr 53.3, lovw/end 09/05/20

## 2020-12-12 ENCOUNTER — Other Ambulatory Visit: Payer: Self-pay | Admitting: Family

## 2020-12-12 NOTE — Telephone Encounter (Signed)
Please review for refill, Thanks !  

## 2021-02-21 ENCOUNTER — Other Ambulatory Visit: Payer: Self-pay | Admitting: Family

## 2021-03-27 ENCOUNTER — Other Ambulatory Visit: Payer: Self-pay

## 2021-03-27 ENCOUNTER — Encounter: Payer: Self-pay | Admitting: Internal Medicine

## 2021-03-27 ENCOUNTER — Ambulatory Visit: Payer: PPO | Admitting: Internal Medicine

## 2021-03-27 VITALS — BP 120/78 | HR 63 | Ht 74.0 in | Wt 178.0 lb

## 2021-03-27 DIAGNOSIS — I4819 Other persistent atrial fibrillation: Secondary | ICD-10-CM | POA: Diagnosis not present

## 2021-03-27 DIAGNOSIS — Z79899 Other long term (current) drug therapy: Secondary | ICD-10-CM | POA: Diagnosis not present

## 2021-03-27 DIAGNOSIS — I255 Ischemic cardiomyopathy: Secondary | ICD-10-CM

## 2021-03-27 MED ORDER — ELIQUIS 5 MG PO TABS: 5.0000 mg | ORAL_TABLET | Freq: Two times a day (BID) | ORAL | 6 refills | Status: DC

## 2021-03-27 NOTE — Patient Instructions (Addendum)
Medication Instructions:  - Your physician has recommended you make the following change in your medication:   1) STOP Xarelto (do not take this tonight)  2) START Eliquis 5 mg (take the 1st dose tonight)- take 1 tablet by mouth TWICE daily (about every 12 hours)   Samples Given: Eliquis 5 mg Lot: [id number] Exp: 5/24 # 2 boxes  *If you need a refill on your cardiac medications before your next appointment, please call your pharmacy*   Lab Work: - Your physician recommends that you have lab work today: CBC/ CMET/ Magnesium  Sales executive at [facility] 1st desk on the right to check in, past the screening table   If you have labs (blood work) drawn today and your tests are completely normal, you will receive your results only by: Marland Kitchen MyChart Message (if you have MyChart) OR . A paper copy in the mail If you have any lab test that is abnormal or we need to change your treatment, we will call you to review the results.   Testing/Procedures: - none ordered   Follow-Up: At [facility], you and your health needs are our priority.  As part of our continuing mission to provide you with exceptional heart care, we have created designated Provider Care Teams.  These Care Teams include your primary Cardiologist (physician) and Advanced Practice Providers (APPs -  Physician Assistants and Nurse Practitioners) who all work together to provide you with the care you need, when you need it.  We recommend signing up for the patient portal called "MyChart".  Sign up information is provided on this After Visit Summary.  MyChart is used to connect with patients for Virtual Visits (Telemedicine).  Patients are able to view lab/test results, encounter notes, upcoming appointments, etc.  Non-urgent messages can be sent to your provider as well.   To learn more about what you can do with MyChart, go to [email or web address].    Your next appointment:   6 month(s)  The format for your next  appointment:   In Person  Provider:   You may see Yvonne Kendall, MD or one of the following Advanced Practice Providers on your designated Care Team:    Nicolasa Ducking, NP  Eula Listen, PA-C  Marisue Ivan, PA-C  Cadence Burnham, [place]  Gillian Shields, NP    Other Instructions n/a

## 2021-03-27 NOTE — Progress Notes (Signed)
Follow-up Outpatient Visit Date: 03/27/2021  Primary Care Provider: Lorenso Quarry, NP [address] [place] [address]  Chief Complaint: Follow-up coronary artery disease  HPI:  Danny Gilmore is a 75 y.o. male with history of NSTEMI (06/2019) that was managed medicallywithout coronary angiography due to comorbidities,persistentatrial fibrillation (diagnosed 06/2019),heart failure with reduced ejection fraction, hypertension, hyperlipidemia, hyperglycemia, and questionable history of dementia, who presents for follow-up of coronary artery disease, cardiomyopathy, and paroxysmal atrial fibrillation.  I last saw him in 08/2020, at which time his main complaint was cough.  He otherwise was feeling well.  It was noted that metoprolol had been discontinued for uncertain reasons in the past.  He continued to smoke and was not interested in quitting.  Today, Danny Gilmore remains most concerned about his cough, which has continued to worsen.  It is most pronounced at night.  He continues to smoke and is not interested in quitting.  He has never been evaluated by a pulmonologist to his knowledge.  He denies chest pain, shortness of breath, palpitations, lightheadedness, and edema.  Since our last visit, rivaroxaban was decreased to 10 mg daily, ostensibly due to renal impairment.  He denies bleeding.  --------------------------------------------------------------------------------------------------  Past Medical History:  Diagnosis Date  . HFrEF (heart failure with reduced ejection fraction) (HCC)    a. EF 40-45%, diffuse HK w/ images suggestive of mod HK of the inf & inferolat wall, normal RVSF and cavity size, RVSP 32.8 mmHg, mod MR, small pericardial effusion  . Hyperlipidemia   . Hypertension   . NSTEMI (non-ST elevated myocardial infarction) (HCC)   . Paroxysmal atrial fibrillation (HCC)    a. diagnosed 06/2019; b. CHADS2VASc 4 (CHF, HTN, age x 1, vascular dz); c. Xarelto  . Syncope and  collapse   . Tobacco use    History reviewed. No pertinent surgical history.  Current Meds  Medication Sig  . apixaban (ELIQUIS) 5 MG TABS tablet Take 1 tablet (5 mg total) by mouth 2 (two) times daily.  Marland Kitchen atorvastatin (LIPITOR) 40 MG tablet TAKE 1 TABLET BY MOUTH EVERY DAY  . Cyanocobalamin (VITAMIN B-12 PO) Take by mouth daily.  . memantine (NAMENDA) 10 MG tablet Take 10 mg by mouth 2 (two) times daily.  . Multiple Vitamins-Minerals (VITAMIN D3 COMPLETE PO) Take by mouth.  . [DISCONTINUED] XARELTO 20 MG TABS tablet TAKE 1 TABLET (20 MG TOTAL) BY MOUTH DAILY WITH SUPPER.    Allergies: Patient has no known allergies.  Social History   Tobacco Use  . Smoking status: Current Every Day Smoker    Packs/day: 0.50    Years: 50.00    Pack years: 25.00    Types: Cigarettes  . Smokeless tobacco: Never Used  Vaping Use  . Vaping Use: Never used  Substance Use Topics  . Alcohol use: Not Currently  . Drug use: Not Currently    Family History  Problem Relation Age of Onset  . Stroke Mother   . Cancer Father     Review of Systems: A 12-system review of systems was performed and was negative except as noted in the HPI.  --------------------------------------------------------------------------------------------------  Physical Exam: BP 120/78 (BP Location: Left Arm, Patient Position: Sitting, Cuff Size: Large)   Pulse 63   Ht 6\' 2"  (1.88 m)   Wt 178 lb (80.7 kg)   SpO2 96%   BMI 22.85 kg/m   General:  NAD. Neck: No JVD or HJR. Lungs: Diminished breath sounds throughout without wheezes or crackles.  Normal work of  breathing. Heart: Regular rate and rhythm with occasional extrasystoles.  No murmurs, rubs, or gallops. Abdomen: Soft, nontender, nondistended. Extremities: No lower extremity edema.  EKG: Sinus rhythm with frequent PVCs, left axis deviation, and early R wave transition.  No significant change from prior tracing on 09/05/2020.  Lab Results  Component Value  Date   WBC 10.1 07/28/2019   HGB 16.7 07/28/2019   HCT 50.6 07/28/2019   MCV 91 07/28/2019   PLT 266 07/28/2019    Lab Results  Component Value Date   NA 136 10/25/2019   K 3.5 10/25/2019   CL 99 10/25/2019   CO2 27 10/25/2019   BUN 16 10/25/2019   CREATININE 1.44 (H) 10/25/2019   GLUCOSE 94 10/25/2019   ALT 44 10/25/2019    Lab Results  Component Value Date   CHOL 126 10/25/2019   HDL 31 (L) 10/25/2019   LDLCALC 67 10/25/2019   TRIG 139 10/25/2019   CHOLHDL 4.1 10/25/2019    --------------------------------------------------------------------------------------------------  ASSESSMENT AND PLAN: Coronary artery disease: No angina reported.  EKG today again shows sinus rhythm with possible inferior/posterior MI as well as multiple PVCs.  Danny Gilmore again wishes to defer ischemia evaluation.  Given absence of symptoms, we will defer medication changes.  Chronic HFrEF: Danny Gilmore appears euvolemic on exam and denies any exertional symptoms.  Echo at the time of his NSTEMI in 2020 showed EF of 40-45%.  Given his comorbidities and lack of symptoms, we have agreed to defer addition of goal-directed medical therapy.  Paroxysmal atrial fibrillation: Danny Gilmore is maintaining sinus rhythm today.  His PCP had lowered rivaroxaban to 10 mg daily, which is subtherapeutic.  We have agreed to switch to apixaban 5 mg twice daily.  I will check a CBC and CMP today.  PVCs: PVC's again noted today.  We will check a CMP and magnesium level.  Tobacco abuse: Smoking cessation was encouraged.  Danny Gilmore is not interested in this time.  Cough: Chronic but worsening, especially at night.  Exam notable for diminished breath sounds bilaterally.  I suspect he has underlying COPD that may be contributing.  I have encouraged him to follow-up with his PCP as scheduled next week for further evaluation.  Addition of a bronchodilator and or pulmonary consultation is recommended.  Follow-up: Return to  clinic in 6 months.  Yvonne Kendall, MD 03/27/2021 1:53 PM

## 2021-03-28 ENCOUNTER — Encounter: Payer: Self-pay | Admitting: Internal Medicine

## 2021-03-28 LAB — CBC
Hematocrit: 50.6 % (ref 37.5–51.0)
Hemoglobin: 17.2 g/dL (ref 13.0–17.7)
MCH: 30.9 pg (ref 26.6–33.0)
MCHC: 34 g/dL (ref 31.5–35.7)
MCV: 91 fL (ref 79–97)
Platelets: 224 10*3/uL (ref 150–450)
RBC: 5.57 x10E6/uL (ref 4.14–5.80)
RDW: 12.3 % (ref 11.6–15.4)
WBC: 8.8 10*3/uL (ref 3.4–10.8)

## 2021-03-28 LAB — COMPREHENSIVE METABOLIC PANEL
ALT: 37 IU/L (ref 0–44)
AST: 35 IU/L (ref 0–40)
Albumin/Globulin Ratio: 2 (ref 1.2–2.2)
Albumin: 4.9 g/dL — ABNORMAL HIGH (ref 3.7–4.7)
Alkaline Phosphatase: 121 IU/L (ref 44–121)
BUN/Creatinine Ratio: 10 (ref 10–24)
BUN: 15 mg/dL (ref 8–27)
Bilirubin Total: 1 mg/dL (ref 0.0–1.2)
CO2: 23 mmol/L (ref 20–29)
Calcium: 9.6 mg/dL (ref 8.6–10.2)
Chloride: 100 mmol/L (ref 96–106)
Creatinine, Ser: 1.5 mg/dL — ABNORMAL HIGH (ref 0.76–1.27)
Globulin, Total: 2.4 g/dL (ref 1.5–4.5)
Glucose: 84 mg/dL (ref 65–99)
Potassium: 4.2 mmol/L (ref 3.5–5.2)
Sodium: 141 mmol/L (ref 134–144)
Total Protein: 7.3 g/dL (ref 6.0–8.5)
eGFR: 48 mL/min/{1.73_m2} — ABNORMAL LOW (ref >59)

## 2021-03-28 NOTE — Progress Notes (Signed)
Brandy called [facility] and spoke with Debby Bud to have magnesium level added.

## 2021-04-01 LAB — MAGNESIUM: Magnesium: 2.5 mg/dL — ABNORMAL HIGH (ref 1.6–2.3)

## 2021-04-01 LAB — SPECIMEN STATUS REPORT

## 2021-04-02 DIAGNOSIS — I950 Idiopathic hypotension: Secondary | ICD-10-CM | POA: Diagnosis not present

## 2021-04-02 DIAGNOSIS — I480 Paroxysmal atrial fibrillation: Secondary | ICD-10-CM | POA: Diagnosis not present

## 2021-04-02 DIAGNOSIS — F0150 Vascular dementia without behavioral disturbance: Secondary | ICD-10-CM | POA: Diagnosis not present

## 2021-04-02 DIAGNOSIS — E538 Deficiency of other specified B group vitamins: Secondary | ICD-10-CM | POA: Diagnosis not present

## 2021-04-02 DIAGNOSIS — Z720 Tobacco use: Secondary | ICD-10-CM | POA: Diagnosis not present

## 2021-04-02 DIAGNOSIS — E559 Vitamin D deficiency, unspecified: Secondary | ICD-10-CM | POA: Diagnosis not present

## 2021-05-30 ENCOUNTER — Other Ambulatory Visit: Payer: Self-pay | Admitting: Internal Medicine

## 2021-08-06 IMAGING — CT CT HEAD WITHOUT CONTRAST
3 series · 15 of 47 positions shown, 18 images · non-contrast
Comparison: MRI report from 02/22/2003.  Images not available.

CLINICAL DATA: 73-year-old with encephalopathy.

EXAM:
CT HEAD WITHOUT CONTRAST
TECHNIQUE: Contiguous axial images were obtained from the base of the skull
through the vertex without intravenous contrast.

[Series 3: head wo · axial · 0.47mm/px · z∈[-134,-9]mm · 9 of 30 slices shown, 12 images]
[im 3/30  brain]
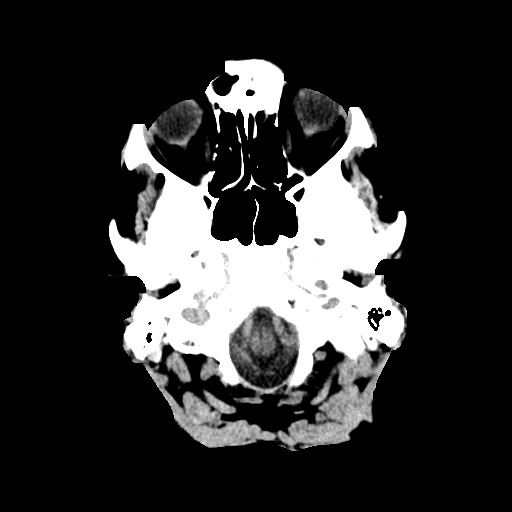
[im 3/30  bone]
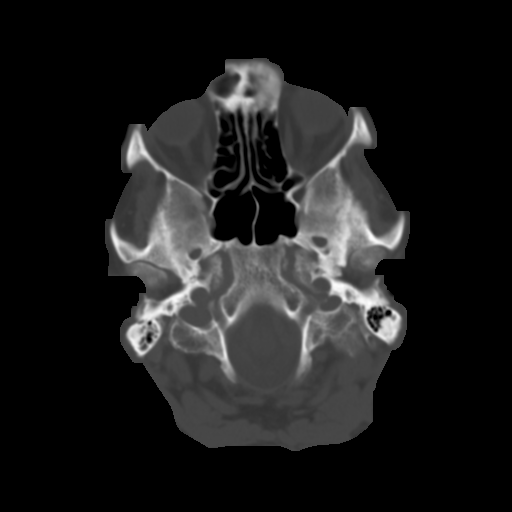
[im 6/30  brain]
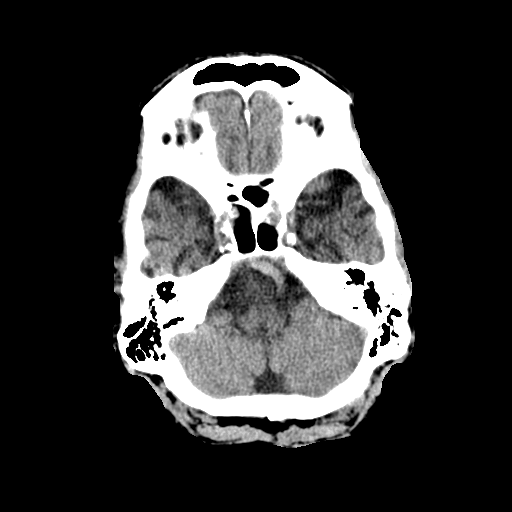
[im 9/30  brain]
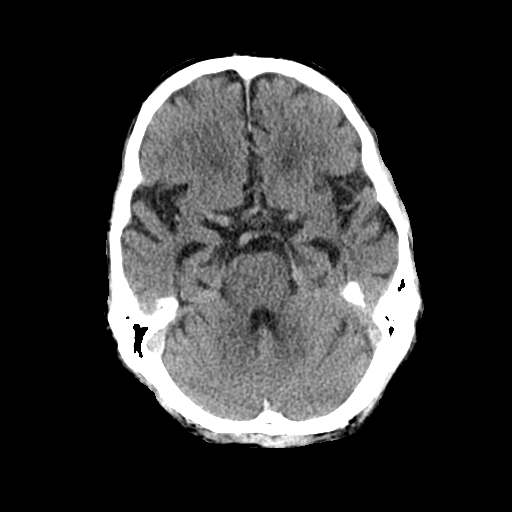
[im 12/30  brain]
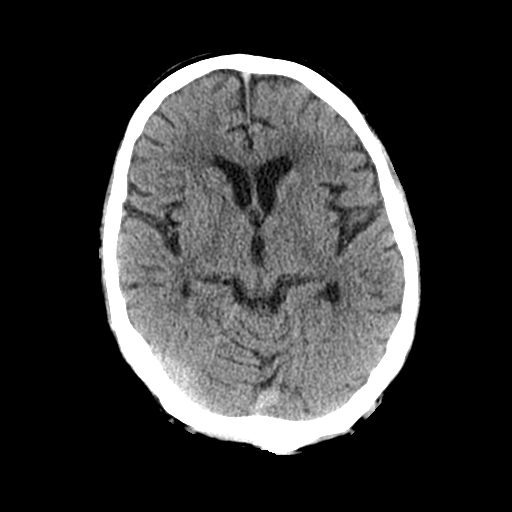
[im 16/30  brain]
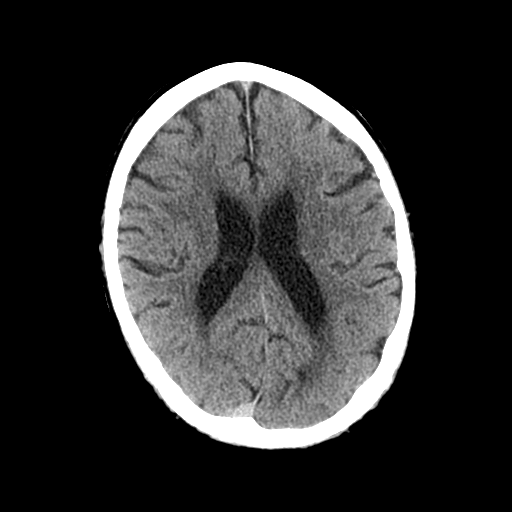
[im 16/30  bone]
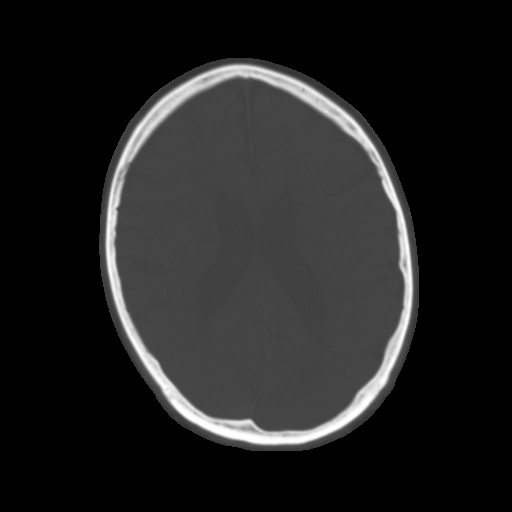
[im 19/30  brain]
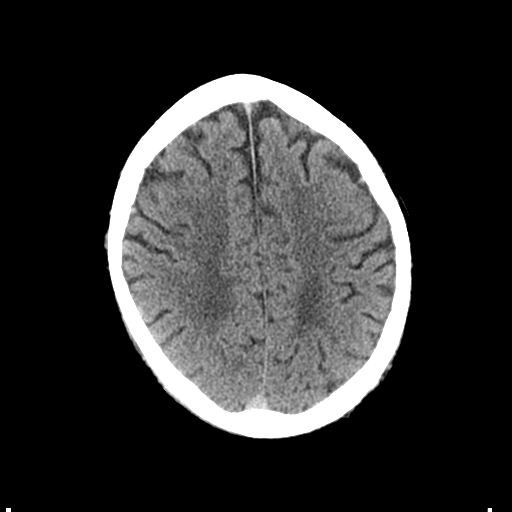
[im 22/30  brain]
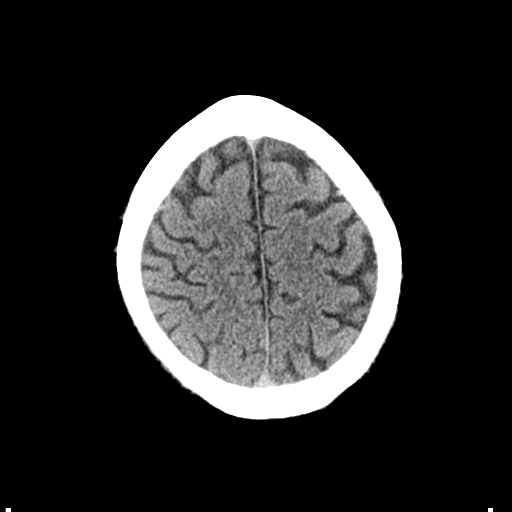
[im 25/30  brain]
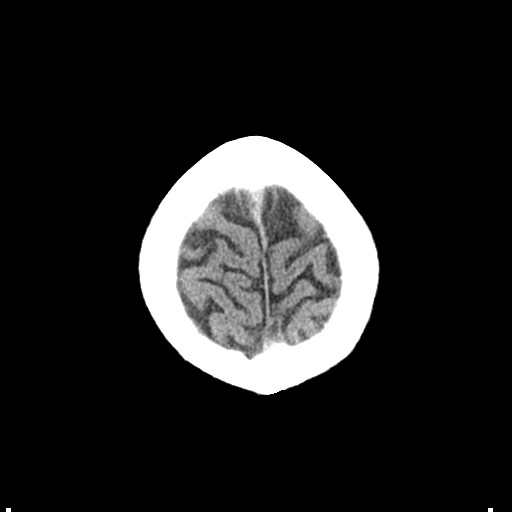
[im 28/30  brain]
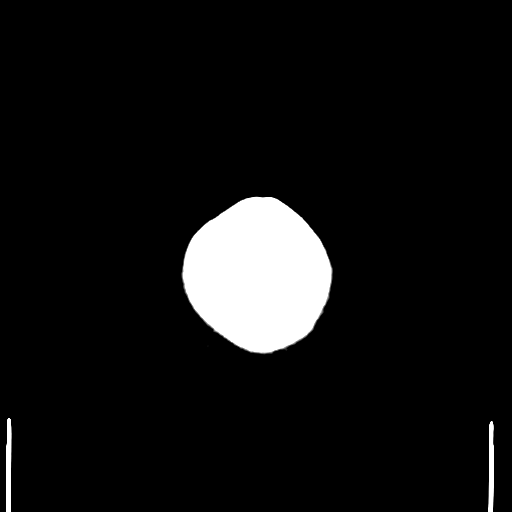
[im 28/30  bone]
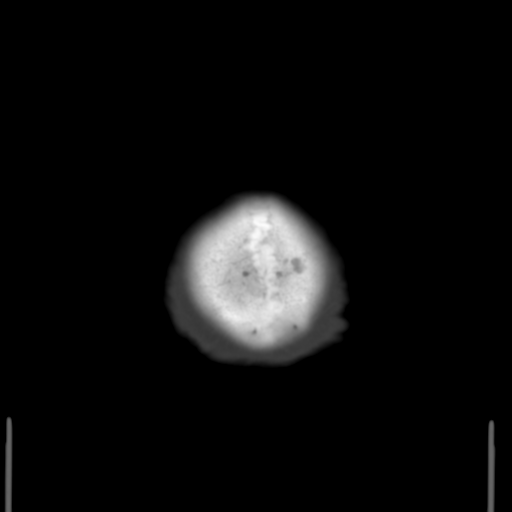

[Series 4: coronal soft tissue · coronal · 0.30mm/px · 3 of 64 slices shown]
[im 22/64  brain]
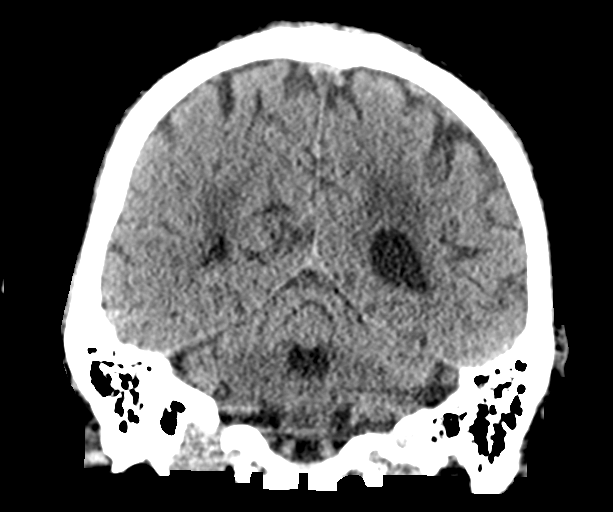
[im 29/64  brain]
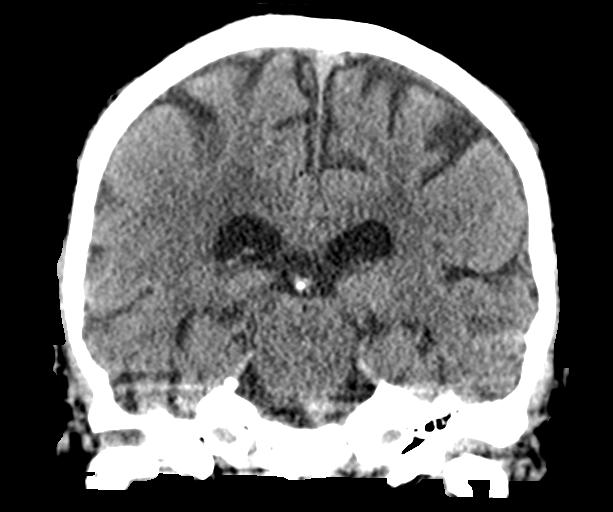
[im 36/64  brain]
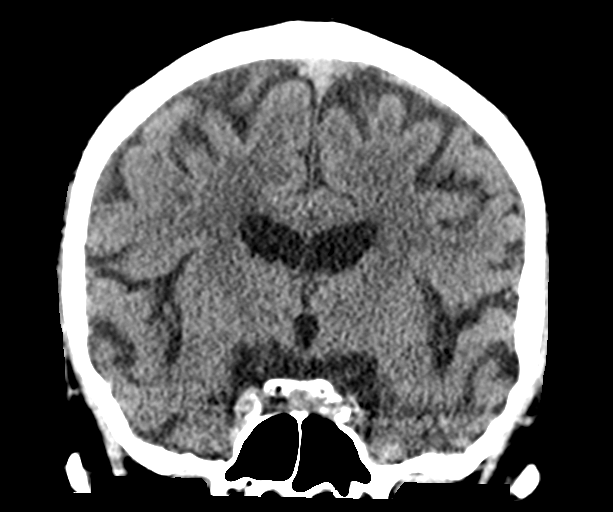

[Series 5: sagittal soft tissue · sagittal · 0.29mm/px · 3 of 54 slices shown]
[im 18/54  brain]
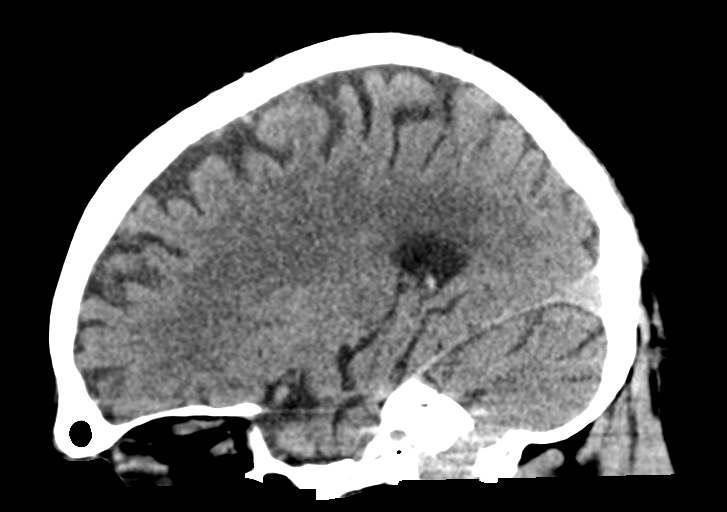
[im 27/54  brain]
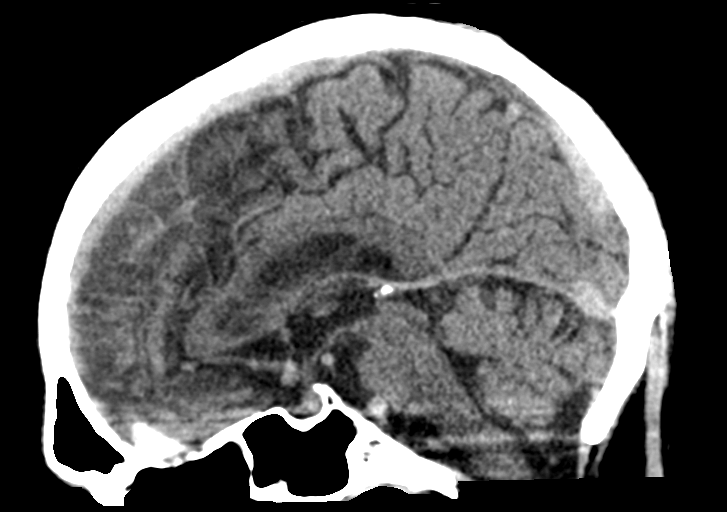
[im 36/54  brain]
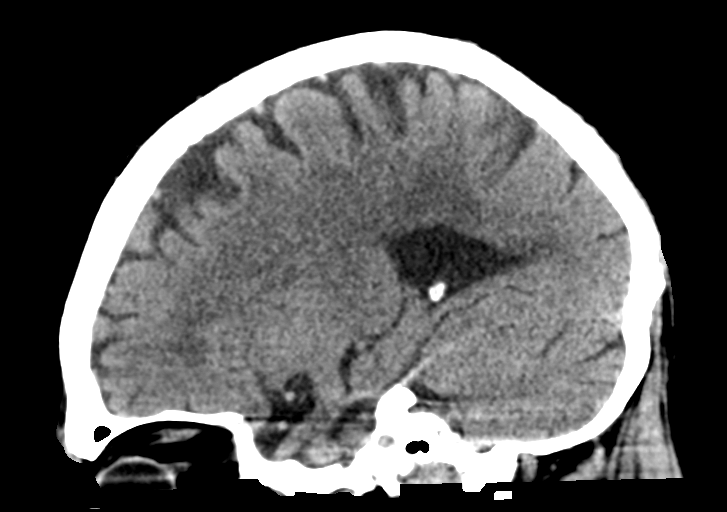

[15 of 47 positions shown; findings below may reference images not displayed]

FINDINGS: Brain: No evidence for acute hemorrhage, mass lesion, midline shift,
hydrocephalus or large infarct. Low-density in the white matter is
suggestive for chronic changes.

Vascular: No hyperdense vessel or unexpected calcification.

Skull: Normal. Negative for fracture or focal lesion.

Sinuses/Orbits: Visualized paranasal sinuses are clear.

Other: None
IMPRESSION: 1. No acute intracranial abnormality.
2. Low-density in the white matter is suggestive for chronic small
vessel ischemic changes.

## 2021-08-28 ENCOUNTER — Other Ambulatory Visit: Payer: Self-pay | Admitting: Internal Medicine

## 2021-09-24 NOTE — Progress Notes (Signed)
Follow-up Outpatient Visit Date: 09/25/2021  Primary Care Provider: Lorenso Quarry, NP [address] [place] [address]  Chief Complaint: Follow-up coronary artery disease and atrial fibrillation  HPI:  Danny Gilmore is a 75 y.o. male with history of NSTEMI (06/2019) that was managed medically without coronary angiography due to comorbidities, persistent atrial fibrillation (diagnosed 06/2019), heart failure with reduced ejection fraction, hypertension, hyperlipidemia, hyperglycemia, and questionable history of dementia, who presents for follow-up of coronary artery disease, atrial fibrillation, and cardiomyopathy.  I last saw him in May, at which time he complained about worsening cough, especially at night.  He continued to smoke and was not interested in quitting.  Multiple PVCs as well as possible inferior/posterior MI were noted on EKG.  Danny Gilmore again declined ischemia evaluation.  We agreed to switch anticoagulation to apixaban 5 mg twice daily.  Further evaluation of nocturnal cough by his PCP was recommended.  Today, Danny Gilmore reports he is feeling well, denying chest pain, shortness of breath, palpitations, lightheadedness, and edema.  He walks about a mile a day without any difficulty.  He has not had any falls.  He denies bleeding.  He remains compliant with his medications.  --------------------------------------------------------------------------------------------------  Past Medical History:  Diagnosis Date   HFrEF (heart failure with reduced ejection fraction) (HCC)    a. EF 40-45%, diffuse HK w/ images suggestive of mod HK of the inf & inferolat wall, normal RVSF and cavity size, RVSP 32.8 mmHg, mod MR, small pericardial effusion   Hyperlipidemia    Hypertension    NSTEMI (non-ST elevated myocardial infarction) (HCC)    Paroxysmal atrial fibrillation (HCC)    a. diagnosed 06/2019; b. CHADS2VASc 4 (CHF, HTN, age x 1, vascular dz); c. Xarelto   Syncope and collapse     Tobacco use    History reviewed. No pertinent surgical history.   Allergies: Patient has no known allergies.  Social History   Tobacco Use   Smoking status: Every Day    Packs/day: 0.50    Years: 50.00    Pack years: 25.00    Types: Cigarettes   Smokeless tobacco: Never  Vaping Use   Vaping Use: Never used  Substance Use Topics   Alcohol use: Not Currently   Drug use: Not Currently    Family History  Problem Relation Age of Onset   Stroke Mother    Cancer Father     Review of Systems: A 12-system review of systems was performed and was negative except as noted in the HPI.  --------------------------------------------------------------------------------------------------  Physical Exam: BP 110/82 (BP Location: Left Arm, Patient Position: Sitting, Cuff Size: Normal)   Pulse 93   Ht 6\' 2"  (1.88 m)   Wt 178 lb (80.7 kg)   SpO2 97%   BMI 22.85 kg/m   General:  NAD. Neck: No JVD or HJR. Lungs: Clear to auscultation bilaterally without wheezes or crackles. Heart: Irregularly irregular rhythm without murmurs, rubs, or gallops. Abdomen: Soft, nontender, nondistended. Extremities: No lower extremity edema.  EKG: Atrial fibrillation with low voltage, LAFB, and inferior/posterior infarct.  Compared with prior tracing from 03/27/2021, atrial fibrillation has replaced sinus rhythm with PVCs.  Lab Results  Component Value Date   WBC 8.8 03/27/2021   HGB 17.2 03/27/2021   HCT 50.6 03/27/2021   MCV 91 03/27/2021   PLT 224 03/27/2021    Lab Results  Component Value Date   NA 141 03/27/2021   K 4.2 03/27/2021   CL 100 03/27/2021   CO2  23 03/27/2021   BUN 15 03/27/2021   CREATININE 1.50 (H) 03/27/2021   GLUCOSE 84 03/27/2021   ALT 37 03/27/2021    Lab Results  Component Value Date   CHOL 126 10/25/2019   HDL 31 (L) 10/25/2019   LDLCALC 67 10/25/2019   TRIG 139 10/25/2019   CHOLHDL 4.1 10/25/2019     --------------------------------------------------------------------------------------------------  ASSESSMENT AND PLAN: Coronary artery disease: No chest pain or dyspnea reported.  Patient again declines ischemia evaluation.  Continue atorvastatin for secondary prevention.  Defer aspirin in setting of anticoagulation with apixaban.  Persistent atrial fibrillation: Patient was in sinus rhythm at our last visit but is in atrial fibrillation today.  Given that he is asymptomatic with reasonable rate control, we will defer rhythm control strategy and addition of an AV nodal blocking agent.  Continue anticoagulation with apixaban 5 mg twice daily.  I will check a CMP and CBC today.  Hyperlipidemia: Lipids well controlled on last check almost a year ago.  We will check a CMP and lipid panel today with plans to continue atorvastatin 40 mg daily if LDL remains less than 70.  Follow-up: Return to clinic in 6 months.  Yvonne Kendall, MD 09/25/2021 10:11 AM

## 2021-09-25 ENCOUNTER — Other Ambulatory Visit: Payer: Self-pay

## 2021-09-25 ENCOUNTER — Telehealth: Payer: Self-pay | Admitting: *Deleted

## 2021-09-25 ENCOUNTER — Encounter: Payer: Self-pay | Admitting: Internal Medicine

## 2021-09-25 ENCOUNTER — Ambulatory Visit: Payer: PPO | Admitting: Internal Medicine

## 2021-09-25 VITALS — BP 110/82 | HR 93 | Ht 74.0 in | Wt 178.0 lb

## 2021-09-25 DIAGNOSIS — I214 Non-ST elevation (NSTEMI) myocardial infarction: Secondary | ICD-10-CM

## 2021-09-25 DIAGNOSIS — I4819 Other persistent atrial fibrillation: Secondary | ICD-10-CM

## 2021-09-25 DIAGNOSIS — I2510 Atherosclerotic heart disease of native coronary artery without angina pectoris: Secondary | ICD-10-CM | POA: Diagnosis not present

## 2021-09-25 DIAGNOSIS — E785 Hyperlipidemia, unspecified: Secondary | ICD-10-CM | POA: Diagnosis not present

## 2021-09-25 DIAGNOSIS — Z79899 Other long term (current) drug therapy: Secondary | ICD-10-CM | POA: Diagnosis not present

## 2021-09-25 NOTE — Telephone Encounter (Signed)
Attempted to call pt's son Augusto Gamble. Number given today in office for contact with pt present.  Pt had labs completed in office Lipid panel and CMET, CBC was not completed.  Calling son to arrange CBC to be drawn.  No answer at this time. Lmtcb.

## 2021-09-25 NOTE — Patient Instructions (Signed)
Medication Instructions:   Your physician recommends that you continue on your current medications as directed. Please refer to the Current Medication list given to you today.  *If you need a refill on your cardiac medications before your next appointment, please call your pharmacy*   Lab Work:  Today: CBC, CMET, Lipid panel  If you have labs (blood work) drawn today and your tests are completely normal, you will receive your results only by: MyChart Message (if you have MyChart) OR A paper copy in the mail If you have any lab test that is abnormal or we need to change your treatment, we will call you to review the results.   Testing/Procedures:  None ordered   Follow-Up: At [facility], you and your health needs are our priority.  As part of our continuing mission to provide you with exceptional heart care, we have created designated Provider Care Teams.  These Care Teams include your primary Cardiologist (physician) and Advanced Practice Providers (APPs -  Physician Assistants and Nurse Practitioners) who all work together to provide you with the care you need, when you need it.  We recommend signing up for the patient portal called "MyChart".  Sign up information is provided on this After Visit Summary.  MyChart is used to connect with patients for Virtual Visits (Telemedicine).  Patients are able to view lab/test results, encounter notes, upcoming appointments, etc.  Non-urgent messages can be sent to your provider as well.   To learn more about what you can do with MyChart, go to [email or web address].    Your next appointment:   6 month(s)  The format for your next appointment:   In Person  Provider:   You may see Yvonne Kendall, MD or one of the following Advanced Practice Providers on your designated Care Team:   Nicolasa Ducking, NP Eula Listen, PA-C Cadence Fransico Michael, [place]

## 2021-09-26 LAB — LIPID PANEL
Chol/HDL Ratio: 3.5 ratio (ref 0.0–5.0)
Cholesterol, Total: 126 mg/dL (ref 100–199)
HDL: 36 mg/dL — ABNORMAL LOW (ref >39)
LDL Chol Calc (NIH): 69 mg/dL (ref 0–99)
Triglycerides: 117 mg/dL (ref 0–149)
VLDL Cholesterol Cal: 21 mg/dL (ref 5–40)

## 2021-09-26 LAB — COMPREHENSIVE METABOLIC PANEL
ALT: 54 IU/L — ABNORMAL HIGH (ref 0–44)
AST: 52 IU/L — ABNORMAL HIGH (ref 0–40)
Albumin/Globulin Ratio: 2.6 — ABNORMAL HIGH (ref 1.2–2.2)
Albumin: 5 g/dL — ABNORMAL HIGH (ref 3.7–4.7)
Alkaline Phosphatase: 126 IU/L — ABNORMAL HIGH (ref 44–121)
BUN/Creatinine Ratio: 10 (ref 10–24)
BUN: 14 mg/dL (ref 8–27)
Bilirubin Total: 1 mg/dL (ref 0.0–1.2)
CO2: 24 mmol/L (ref 20–29)
Calcium: 9.4 mg/dL (ref 8.6–10.2)
Chloride: 101 mmol/L (ref 96–106)
Creatinine, Ser: 1.34 mg/dL — ABNORMAL HIGH (ref 0.76–1.27)
Globulin, Total: 1.9 g/dL (ref 1.5–4.5)
Glucose: 68 mg/dL — ABNORMAL LOW (ref 70–99)
Potassium: 4.1 mmol/L (ref 3.5–5.2)
Sodium: 142 mmol/L (ref 134–144)
Total Protein: 6.9 g/dL (ref 6.0–8.5)
eGFR: 55 mL/min/{1.73_m2} — ABNORMAL LOW (ref >59)

## 2021-09-27 NOTE — Telephone Encounter (Signed)
Spoke with pt's son Augusto Gamble, pt has given approval to speak with son at office visit 09/25/21.  Notified of lab results and Dr. Serita Kyle recc.  Jody voiced understanding.   Lab appointment also scheduled for next Tuesday 10/01/21 at 1130 for CBC that was not drawn at office visit that was ordered along with resulted labs below.

## 2021-09-27 NOTE — Telephone Encounter (Signed)
-----   Message from Yvonne Kendall, MD sent at 09/26/2021  6:57 AM EST ----- Please let Mr. Ahlgren know that his kidney function and electrolytes are stable.  His cholesterol is also adequately controlled.  His liver function tests are mildly abnormal.  I suggest that he follow-up with his PCP for further evaluation of the abnormal liver tests.

## 2021-10-01 ENCOUNTER — Other Ambulatory Visit (INDEPENDENT_AMBULATORY_CARE_PROVIDER_SITE_OTHER): Payer: PPO

## 2021-10-01 ENCOUNTER — Other Ambulatory Visit: Payer: Self-pay

## 2021-10-01 DIAGNOSIS — I4819 Other persistent atrial fibrillation: Secondary | ICD-10-CM | POA: Diagnosis not present

## 2021-10-01 DIAGNOSIS — I2510 Atherosclerotic heart disease of native coronary artery without angina pectoris: Secondary | ICD-10-CM | POA: Diagnosis not present

## 2021-10-01 DIAGNOSIS — I214 Non-ST elevation (NSTEMI) myocardial infarction: Secondary | ICD-10-CM

## 2021-10-01 NOTE — Addendum Note (Signed)
Addended by: Lanny Hurst on: 10/01/2021 12:09 PM   Modules accepted: Orders

## 2021-10-02 LAB — CBC
Hematocrit: 49 % (ref 37.5–51.0)
Hemoglobin: 16.7 g/dL (ref 13.0–17.7)
MCH: 30.7 pg (ref 26.6–33.0)
MCHC: 34.1 g/dL (ref 31.5–35.7)
MCV: 90 fL (ref 79–97)
Platelets: 211 10*3/uL (ref 150–450)
RBC: 5.44 x10E6/uL (ref 4.14–5.80)
RDW: 11.9 % (ref 11.6–15.4)
WBC: 8.9 10*3/uL (ref 3.4–10.8)

## 2021-10-03 DIAGNOSIS — I2510 Atherosclerotic heart disease of native coronary artery without angina pectoris: Secondary | ICD-10-CM | POA: Diagnosis not present

## 2021-10-03 DIAGNOSIS — Z0000 Encounter for general adult medical examination without abnormal findings: Secondary | ICD-10-CM | POA: Diagnosis not present

## 2021-10-03 DIAGNOSIS — E559 Vitamin D deficiency, unspecified: Secondary | ICD-10-CM | POA: Diagnosis not present

## 2021-10-03 DIAGNOSIS — I950 Idiopathic hypotension: Secondary | ICD-10-CM | POA: Diagnosis not present

## 2021-10-03 DIAGNOSIS — I252 Old myocardial infarction: Secondary | ICD-10-CM | POA: Diagnosis not present

## 2021-10-03 DIAGNOSIS — F0150 Vascular dementia without behavioral disturbance: Secondary | ICD-10-CM | POA: Diagnosis not present

## 2021-10-03 DIAGNOSIS — E538 Deficiency of other specified B group vitamins: Secondary | ICD-10-CM | POA: Diagnosis not present

## 2021-10-03 DIAGNOSIS — E785 Hyperlipidemia, unspecified: Secondary | ICD-10-CM | POA: Diagnosis not present

## 2021-10-03 DIAGNOSIS — I255 Ischemic cardiomyopathy: Secondary | ICD-10-CM | POA: Diagnosis not present

## 2021-10-03 DIAGNOSIS — I480 Paroxysmal atrial fibrillation: Secondary | ICD-10-CM | POA: Diagnosis not present

## 2021-10-03 DIAGNOSIS — I5022 Chronic systolic (congestive) heart failure: Secondary | ICD-10-CM | POA: Diagnosis not present

## 2021-10-03 DIAGNOSIS — Z720 Tobacco use: Secondary | ICD-10-CM | POA: Diagnosis not present

## 2021-10-15 ENCOUNTER — Other Ambulatory Visit: Payer: PPO

## 2021-11-01 ENCOUNTER — Other Ambulatory Visit: Payer: Self-pay | Admitting: Internal Medicine

## 2021-11-01 NOTE — Telephone Encounter (Signed)
Refill Request.  

## 2021-11-01 NOTE — Telephone Encounter (Signed)
Pt last saw Dr End 09/25/21, last labs 09/25/21 Creat 1.34, age 75, weight 80.7kg, based on specified criteria pt is on appropriate dosage of Eliquis 5mg  BID for afib.  Will refill rx.

## 2021-11-27 ENCOUNTER — Other Ambulatory Visit: Payer: Self-pay | Admitting: Internal Medicine

## 2022-02-26 ENCOUNTER — Other Ambulatory Visit: Payer: Self-pay | Admitting: Internal Medicine

## 2022-03-27 NOTE — Progress Notes (Signed)
? ?Follow-up Outpatient Visit ?Date: 03/28/2022 ? ?Primary Care Provider: ?Luciana Axe, NP ?[address] ?[place] [address] ? ?Chief Complaint: Follow-up coronary artery disease ? ?HPI:  Mr. Venditto is a 76 y.o. male with history of NSTEMI (06/2019) that was managed medically without coronary angiography due to comorbidities, persistent atrial fibrillation (diagnosed 06/2019), heart failure with reduced ejection fraction, hypertension, hyperlipidemia, hyperglycemia, and questionable history of dementia, who presents for follow-up of coronary artery disease, atrial fibrillation, and cardiomyopathy.  I last saw him in 09/2021, at which time he was feeling well without chest pain, shortness of breath, and palpitations.  We did not make any medication changes or pursue additional testing. ? ?Mr. Weidinger does not have any complaints today, denies chest pain, shortness of breath, palpitations, lightheadedness, edema, falls, and bleeding.  He remains compliant with his medications, including apixaban.  He tries to walk about 2 miles a day, which he does without difficulty. ? ?-------------------------------------------------------------------------------------------------- ? ?Past Medical History:  ?Diagnosis Date  ? HFrEF (heart failure with reduced ejection fraction) (HCC)   ? a. EF 40-45%, diffuse HK w/ images suggestive of mod HK of the inf & inferolat wall, normal RVSF and cavity size, RVSP 32.8 mmHg, mod MR, small pericardial effusion  ? Hyperlipidemia   ? Hypertension   ? NSTEMI (non-ST elevated myocardial infarction) (HCC)   ? Paroxysmal atrial fibrillation (HCC)   ? a. diagnosed 06/2019; b. CHADS2VASc 4 (CHF, HTN, age x 1, vascular dz); c. Xarelto  ? Syncope and collapse   ? Tobacco use   ? ?History reviewed. No pertinent surgical history. ? ? ?Current Meds  ?Medication Sig  ? apixaban (ELIQUIS) 5 MG TABS tablet TAKE 1 TABLET BY MOUTH TWICE A DAY  ? atorvastatin (LIPITOR) 40 MG tablet TAKE 1 TABLET BY  MOUTH EVERY DAY  ? Cyanocobalamin (VITAMIN B-12 PO) Take by mouth daily.  ? memantine (NAMENDA) 10 MG tablet Take 10 mg by mouth 2 (two) times daily.  ? metoprolol succinate (TOPROL-XL) 25 MG 24 hr tablet Take 0.5 tablets (12.5 mg total) by mouth daily. Take with or immediately following a meal.  ? ? ?Allergies: Patient has no known allergies. ? ?Social History  ? ?Tobacco Use  ? Smoking status: Every Day  ?  Packs/day: 0.50  ?  Years: 50.00  ?  Pack years: 25.00  ?  Types: Cigarettes  ? Smokeless tobacco: Never  ?Vaping Use  ? Vaping Use: Never used  ?Substance Use Topics  ? Alcohol use: Not Currently  ? Drug use: Not Currently  ? ? ?Family History  ?Problem Relation Age of Onset  ? Stroke Mother   ? Cancer Father   ? ? ?Review of Systems: ?A 12-system review of systems was performed and was negative except as noted in the HPI. ? ?-------------------------------------------------------------------------------------------------- ? ?Physical Exam: ?BP 110/74 (BP Location: Left Arm, Patient Position: Sitting, Cuff Size: Normal)   Pulse 95   Ht 6\' 2"  (1.88 m)   Wt 174 lb (78.9 kg)   SpO2 98%   BMI 22.34 kg/m?  ? ?General:  NAD. ?Neck: No JVD or HJR. ?Lungs: Clear to auscultation bilaterally without wheezes or crackles. ?Heart: Irregularly irregular rhythm without murmurs, rubs, or gallops. ?Abdomen: Soft, nontender, nondistended. ?Extremities: No lower extremity edema. ? ?EKG: Atrial fibrillation (ventricular rate 95 bpm) with left axis deviation, low voltage, possible inferior infarct, and nonspecific T wave changes.  No significant change since 09/25/2021. ? ?Lab Results  ?Component Value Date  ? WBC  10.1 03/28/2022  ? HGB 16.7 03/28/2022  ? HCT 51.6 03/28/2022  ? MCV 93.5 03/28/2022  ? PLT 245 03/28/2022  ? ? ?Lab Results  ?Component Value Date  ? NA 139 03/28/2022  ? K 3.6 03/28/2022  ? CL 103 03/28/2022  ? CO2 28 03/28/2022  ? BUN 15 03/28/2022  ? CREATININE 1.41 (H) 03/28/2022  ? GLUCOSE 78 03/28/2022  ? ALT  34 03/28/2022  ? ? ?Lab Results  ?Component Value Date  ? CHOL 126 09/25/2021  ? HDL 36 (L) 09/25/2021  ? LDLCALC 69 09/25/2021  ? TRIG 117 09/25/2021  ? CHOLHDL 3.5 09/25/2021  ? ? ?-------------------------------------------------------------------------------------------------- ? ?ASSESSMENT AND PLAN: ?Persistent atrial fibrillation: ?Mr. Wain remains in atrial fibrillation today with upper normal ventricular rate response.  Though he is asymptomatic, I think he may benefit from addition of low-dose beta-blockade to help prevent tachycardia.  We will therefore add metoprolol succinate 12.5 mg daily.  We will also arrange for an echocardiogram at his convenience.  We will check a CBC, CMP, and TSH today.  Continue current dose of apixaban. ? ?Chronic HFrEF: ?Echocardiogram at time of NSTEMI in 2022 was reduced at 40-45%.  Mr. Reine is euvolemic on exam today and does not report any symptoms consistent with NYHA class I heart failure.  Given his persistent atrial fibrillation and history of cardiomyopathy, we have agreed to obtain an echo at his convenience.  We will also add low-dose metoprolol, as outlined above.  We will need to be mindful of his chronic borderline low blood pressures. ? ?Coronary artery disease: ?No angina reported with prior medical management of NSTEMI.  Continue atorvastatin for secondary prevention as well as apixaban and lieu of aspirin in the setting of persistent atrial fibrillation. ? ?Hyperlipidemia: ?LDL reasonable at 69 on last check in 1122.  HDL a bit low at that time.  Continue atorvastatin 40 mg daily.  I encouraged Mr. Engelbert to continue walking regularly. ? ?Follow-up: Return to clinic in 3 months. ? ?Yvonne Kendall, MD ?03/29/2022 ?8:36 PM ? ?

## 2022-03-28 ENCOUNTER — Other Ambulatory Visit
Admission: RE | Admit: 2022-03-28 | Discharge: 2022-03-28 | Disposition: A | Discharge status: Home / Self Care | Payer: PPO | Source: Ambulatory Visit | Attending: Internal Medicine | Admitting: Internal Medicine

## 2022-03-28 ENCOUNTER — Encounter: Payer: Self-pay | Admitting: Internal Medicine

## 2022-03-28 ENCOUNTER — Ambulatory Visit: Payer: PPO | Admitting: Internal Medicine

## 2022-03-28 VITALS — BP 110/74 | HR 95 | Ht 74.0 in | Wt 174.0 lb

## 2022-03-28 DIAGNOSIS — I4819 Other persistent atrial fibrillation: Secondary | ICD-10-CM

## 2022-03-28 DIAGNOSIS — I5020 Unspecified systolic (congestive) heart failure: Secondary | ICD-10-CM

## 2022-03-28 DIAGNOSIS — E785 Hyperlipidemia, unspecified: Secondary | ICD-10-CM | POA: Diagnosis not present

## 2022-03-28 DIAGNOSIS — I2510 Atherosclerotic heart disease of native coronary artery without angina pectoris: Secondary | ICD-10-CM

## 2022-03-28 LAB — COMPREHENSIVE METABOLIC PANEL
ALT: 34 U/L (ref 0–44)
AST: 41 U/L (ref 15–41)
Albumin: 4.2 g/dL (ref 3.5–5.0)
Alkaline Phosphatase: 109 U/L (ref 38–126)
Anion gap: 8 (ref 5–15)
BUN: 15 mg/dL (ref 8–23)
CO2: 28 mmol/L (ref 22–32)
Calcium: 9.4 mg/dL (ref 8.9–10.3)
Chloride: 103 mmol/L (ref 98–111)
Creatinine, Ser: 1.41 mg/dL — ABNORMAL HIGH (ref 0.61–1.24)
GFR, Estimated: 52 mL/min — ABNORMAL LOW (ref >60)
Glucose, Bld: 78 mg/dL (ref 70–99)
Potassium: 3.6 mmol/L (ref 3.5–5.1)
Sodium: 139 mmol/L (ref 135–145)
Total Bilirubin: 1.1 mg/dL (ref 0.3–1.2)
Total Protein: 7.4 g/dL (ref 6.5–8.1)

## 2022-03-28 LAB — CBC
HCT: 51.6 % (ref 39.0–52.0)
Hemoglobin: 16.7 g/dL (ref 13.0–17.0)
MCH: 30.3 pg (ref 26.0–34.0)
MCHC: 32.4 g/dL (ref 30.0–36.0)
MCV: 93.5 fL (ref 80.0–100.0)
Platelets: 245 10*3/uL (ref 150–400)
RBC: 5.52 MIL/uL (ref 4.22–5.81)
RDW: 14.1 % (ref 11.5–15.5)
WBC: 10.1 10*3/uL (ref 4.0–10.5)
nRBC: 0 % (ref 0.0–0.2)

## 2022-03-28 LAB — TSH: TSH: 3.109 u[IU]/mL (ref 0.350–4.500)

## 2022-03-28 MED ORDER — METOPROLOL SUCCINATE ER 25 MG PO TB24
12.5000 mg | ORAL_TABLET | Freq: Every day | ORAL | 3 refills | Status: DC
Start: 2022-03-28 — End: 2023-02-26

## 2022-03-28 NOTE — Patient Instructions (Signed)
Medication Instructions:  ?Your physician has recommended you make the following change in your medication:  ? ?START metoprolol succinate (Toprol-XL) 12.5 mg daily  ? ?*If you need a refill on your cardiac medications before your next appointment, please call your pharmacy* ? ? ?Lab Work: ? ?Today: CBC, CMP, TSH. [facility] to [facility] these [facility].  ? ?If you have labs (blood work) drawn today and your tests are completely normal, you will receive your results only by: ?MyChart Message (if you have MyChart) OR ?A paper copy in the mail ?If you have any lab test that is abnormal or we need to change your treatment, we will call you to review the results. ? ? ?Testing/Procedures: ? ?Your physician has requested that you have an echocardiogram. Echocardiography is a painless test that uses sound waves to create images of your heart. It provides your doctor with information about the size and shape of your heart and how well your heart?s chambers and valves are working. This procedure takes approximately one hour. There are no restrictions for this procedure.  ? ? ?Follow-Up: ?At [facility], you and your health needs are our priority.  As part of our continuing mission to provide you with exceptional heart care, we have created designated Provider Care Teams.  These Care Teams include your primary Cardiologist (physician) and Advanced Practice Providers (APPs -  Physician Assistants and Nurse Practitioners) who all work together to provide you with the care you need, when you need it. ? ?We recommend signing up for the patient portal called "MyChart".  Sign up information is provided on this After Visit Summary.  MyChart is used to connect with patients for Virtual Visits (Telemedicine).  Patients are able to view lab/test results, encounter notes, upcoming appointments, etc.  Non-urgent messages can be sent to your provider as well.   ?To learn more about what you can do with MyChart, go to  [email or web address].   ? ?Your next appointment:   ?3 month(s) ? ?The format for your next appointment:   ?In Person ? ?Provider:   ?You may see Nelva Bush, MD or one of the following Advanced Practice Providers on your designated Care Team:   ?Bringhurst Hodgkins, NP ?Christell Faith, PA-C ?Cadence Kathlen Mody, PA-C ? ? ?Other Instructions ?N/A ? ?Important Information About Sugar ? ? ? ? ?  ?

## 2022-03-29 ENCOUNTER — Encounter: Payer: Self-pay | Admitting: Internal Medicine

## 2022-03-29 DIAGNOSIS — I5020 Unspecified systolic (congestive) heart failure: Secondary | ICD-10-CM | POA: Insufficient documentation

## 2022-04-01 ENCOUNTER — Telehealth: Payer: Self-pay | Admitting: *Deleted

## 2022-04-01 NOTE — Telephone Encounter (Signed)
-----   Message from Yvonne Kendall, MD sent at 03/31/2022  7:18 AM EDT ----- ?Please let Mr. Slates know that his labs are stable with mild impairment in his renal function that has not worsened in the last 2 years.  Liver function, electrolytes, blood counts, and thyroid function are normal.  Mr. Bunda should continue his medications as we discussed at Friday's visit.  We will touch base with him again after completion of echocardiogram. ?

## 2022-04-01 NOTE — Telephone Encounter (Signed)
Attempted to call pt on both home and cell numbers. ?No voicemail set up. Unable to leave vm.  ?

## 2022-04-29 ENCOUNTER — Ambulatory Visit (INDEPENDENT_AMBULATORY_CARE_PROVIDER_SITE_OTHER): Payer: PPO

## 2022-04-29 DIAGNOSIS — I5020 Unspecified systolic (congestive) heart failure: Secondary | ICD-10-CM | POA: Diagnosis not present

## 2022-04-29 DIAGNOSIS — I4819 Other persistent atrial fibrillation: Secondary | ICD-10-CM | POA: Diagnosis not present

## 2022-04-29 LAB — ECHOCARDIOGRAM COMPLETE
AR max vel: 3.06 cm2
AV Area VTI: 3.22 cm2
AV Area mean vel: 2.65 cm2
AV Mean grad: 1 mmHg
AV Peak grad: 1.9 mmHg
Ao pk vel: 0.7 m/s
S' Lateral: 3.7 cm

## 2022-05-28 ENCOUNTER — Other Ambulatory Visit: Payer: Self-pay | Admitting: Internal Medicine

## 2022-07-07 NOTE — Progress Notes (Unsigned)
Cardiology Office Note    Date:  07/08/2022   ID:  Artem, Bunte 05-13-1946, MRN [id number]  PCP:  Luciana Axe, NP  Cardiologist:  Yvonne Kendall, MD  Electrophysiologist:  None   Chief Complaint: Follow-up  History of Present Illness:   Danny Gilmore is a 76 y.o. male with history of CAD with NSTEMI medically managed due to comorbidities, persistent Afib diagnosed in 06/2019, HFrEF, questionable history of dementia, hyperglycemia, syncope, HTN, HLD, and tobacco abuse who presents for follow-up of his CAD and cardiomyopathy.   He was admitted to [facility] in 06/2019 following a syncopal episode at [facility]. Upon his arrival to the hospital, he was noted to be in Afib with RVR. Initial high-sensitivity troponin of 4318 with a delta of 3479. CT head was negative.  Echo showed an EF of 40-45%, diffuse hypokinesis with images suggestive of moderate hypokinesis of the inferior and inferolateral wall, normal RVSF and cavity size, RVSP 32.8 mmHg, moderate MR, small pericardial effusion. Cardiac cath was deferred after discussion with the patient given significant comorbidities.  Following his discharge, he was diagnosed with vascular dementia at outside office.  He was last seen in the office in 03/2022 and was without symptoms of angina or decompensation.  He was ambulating 2 miles per day without difficulty.  He remained in A-fib with reasonably controlled ventricular response.  Toprol 12.5 mg daily was added.  He underwent echo in 04/2022 which demonstrated an EF of 45 to 50%, inferior wall hypokinesis, possibly extending into part of the lateral wall, indeterminate LV diastolic function parameters, normal RV systolic function and ventricular cavity size, mildly dilated left atrium, mild mitral regurgitation, moderate mitral annular calcification, and an estimated right atrial pressure of 3 mmHg.  He comes in accompanied by his wife today and is doing well from a cardiac perspective, without  symptoms of angina or decompensation.  No palpitations, lightheadedness, presyncope, or syncope.  No falls, hematochezia, melena, lower extremity swelling, orthopnea, or PND.  He continues to walk about 2 miles per day without cardiac limitation.  He is adherent and tolerating all medications.  His wife does have a BP cuff at home, though has not been checking his blood pressure as of late.  Overall, he feels like he is doing well from a cardiac perspective.   Labs independently reviewed: 03/2022 - potassium 4.0, BUN 13, serum creatinine 1.5, albumin 4.2, AST/ALT normal, Hgb 16.7, PLT 245, TSH normal 09/2021 - TC 126, TG 117, HDL 36, LDL 69 03/2021 - magnesium 2.5 09/2020 - A1c 6.5  Past Medical History:  Diagnosis Date   HFrEF (heart failure with reduced ejection fraction) (HCC)    a. EF 40-45%, diffuse HK w/ images suggestive of mod HK of the inf & inferolat wall, normal RVSF and cavity size, RVSP 32.8 mmHg, mod MR, small pericardial effusion   Hyperlipidemia    Hypertension    NSTEMI (non-ST elevated myocardial infarction) (HCC)    Paroxysmal atrial fibrillation (HCC)    a. diagnosed 06/2019; b. CHADS2VASc 4 (CHF, HTN, age x 1, vascular dz); c. Xarelto   Syncope and collapse    Tobacco use     History reviewed. No pertinent surgical history.  Current Medications: Current Meds  Medication Sig   apixaban (ELIQUIS) 5 MG TABS tablet TAKE 1 TABLET BY MOUTH TWICE A DAY   cetirizine (ZYRTEC) 10 MG chewable tablet Chew 10 mg by mouth daily. 1-2 5mg  nightly   cholecalciferol (VITAMIN D3) 25 MCG (1000  UNIT) tablet Take 1,000 Units by mouth daily.   Cyanocobalamin (VITAMIN B-12 PO) Take by mouth daily.   memantine (NAMENDA) 10 MG tablet Take 10 mg by mouth 2 (two) times daily.   metoprolol succinate (TOPROL-XL) 25 MG 24 hr tablet Take 0.5 tablets (12.5 mg total) by mouth daily. Take with or immediately following a meal.   vitamin C (ASCORBIC ACID) 250 MG tablet Take 250 mg by mouth daily.    [DISCONTINUED] atorvastatin (LIPITOR) 40 MG tablet TAKE 1 TABLET BY MOUTH EVERY DAY    Allergies:   Patient has no known allergies.   Social History   Socioeconomic History   Marital status: Married    Spouse name: Not on file   Number of children: Not on file   Years of education: Not on file   Highest education level: Not on file  Occupational History   Not on file  Tobacco Use   Smoking status: Every Day    Packs/day: 0.50    Years: 50.00    Total pack years: 25.00    Types: Cigarettes   Smokeless tobacco: Never  Vaping Use   Vaping Use: Never used  Substance and Sexual Activity   Alcohol use: Not Currently   Drug use: Not Currently   Sexual activity: Not on file  Other Topics Concern   Not on file  Social History Narrative   Not on file   Social Determinants of Health   Financial Resource Strain: Not on file  Food Insecurity: Not on file  Transportation Needs: Not on file  Physical Activity: Not on file  Stress: Not on file  Social Connections: Not on file     Family History:  The patient's family history includes Cancer in his father; Stroke in his mother.  ROS:   12-point review of systems is negative unless otherwise noted in the HPI.   EKGs/Labs/Other Studies Reviewed:    Studies reviewed were summarized above. The additional studies were reviewed today:  2D echo 04/29/2022: 1. Left ventricular ejection fraction, by estimation, is 45 to 50%. The  left ventricle has mildly decreased function. The left ventricle  demonstrates regional wall motion abnormalities (Inferior wall  hypokinesis, possibly extending into part of the  lateral wall). Left ventricular diastolic parameters are indeterminate.   2. Right ventricular systolic function is normal. The right ventricular  size is normal. Tricuspid regurgitation signal is inadequate for assessing  PA pressure.   3. Left atrial size was mildly dilated.   4. The mitral valve is normal in structure. Mild  mitral valve  regurgitation,two jets. No evidence of mitral stenosis. Moderate mitral  annular calcification.   5. The aortic valve is normal in structure. Aortic valve regurgitation is  not visualized. No aortic stenosis is present.   6. The inferior vena cava is normal in size with greater than 50%  respiratory variability, suggesting right atrial pressure of 3 mmHg.   Comparison(s): 06/2019-EF 40-45%; Moderate MVR. __________  2D echo 07/13/2019: 1. The left ventricle has mild-moderately reduced systolic function, with  an ejection fraction of 40-45%. The cavity size was mildly dilated. Left  ventricular with mild diffuse hypokinesis, images suggestive of moderate  hypokinesis of the inferior and  inferolateral wall.   2. The right ventricle has normal systolic function. The cavity was  normal. There is no increase in right ventricular wall thickness. Right  ventricular systolic pressure is mildly elevated with an estimated  pressure of 32.8 mmHg.   3. Mitral valve regurgitation  is moderate   4. Small pericardial effusion.   EKG:  EKG is ordered today.  The EKG ordered today demonstrates A-fib, 75 bpm, baseline wandering, possible prior inferior infarct, low voltage, nonspecific ST-T changes, no significant change when compared to prior tracing  Recent Labs: 03/28/2022: ALT 34; BUN 15; Creatinine, Ser 1.41; Hemoglobin 16.7; Platelets 245; Potassium 3.6; Sodium 139; TSH 3.109  Recent Lipid Panel    Component Value Date/Time   CHOL 126 09/25/2021 1033   TRIG 117 09/25/2021 1033   HDL 36 (L) 09/25/2021 1033   CHOLHDL 3.5 09/25/2021 1033   CHOLHDL 4.1 10/25/2019 0955   VLDL 28 10/25/2019 0955   LDLCALC 69 09/25/2021 1033    PHYSICAL EXAM:    VS:  BP (!) 92/54 (BP Location: Left Arm, Patient Position: Sitting, Cuff Size: Normal)   Pulse 75   Ht 6\' 2"  (1.88 m)   Wt 176 lb 9.6 oz (80.1 kg)   SpO2 96%   BMI 22.67 kg/m   BMI: Body mass index is 22.67 kg/m.  Physical  Exam Vitals reviewed.  Constitutional:      Appearance: He is well-developed.  HENT:     Head: Normocephalic and atraumatic.  Eyes:     General:        Right eye: No discharge.        Left eye: No discharge.  Neck:     Vascular: No JVD.  Cardiovascular:     Rate and Rhythm: Normal rate. Rhythm irregularly irregular.     Pulses:          Posterior tibial pulses are 2+ on the right side and 2+ on the left side.     Heart sounds: Normal heart sounds, S1 normal and S2 normal. Heart sounds not distant. No midsystolic click and no opening snap. No murmur heard.    No friction rub.  Pulmonary:     Effort: Pulmonary effort is normal. No respiratory distress.     Breath sounds: Normal breath sounds. No decreased breath sounds, wheezing or rales.  Chest:     Chest wall: No tenderness.  Abdominal:     General: There is no distension.     Palpations: Abdomen is soft.     Tenderness: There is no abdominal tenderness.  Musculoskeletal:     Cervical back: Normal range of motion.     Right lower leg: No edema.     Left lower leg: No edema.  Skin:    General: Skin is warm and dry.     Nails: There is no clubbing.  Neurological:     Mental Status: He is alert and oriented to person, place, and time.  Psychiatric:        Speech: Speech normal.        Behavior: Behavior normal.        Thought Content: Thought content normal.        Judgment: Judgment normal.     Wt Readings from Last 3 Encounters:  07/08/22 176 lb 9.6 oz (80.1 kg)  03/28/22 174 lb (78.9 kg)  09/25/21 178 lb (80.7 kg)     ASSESSMENT & PLAN:   CAD involving the native coronary arteries without angina: He is doing well without any symptoms concerning for angina.  Continue aggressive risk factor modification and secondary prevention including apixaban in place of aspirin to minimize bleeding risk, atorvastatin, and metoprolol.  No indication for ischemic testing at this time.  HFrEF: He appears euvolemic and well  compensated with  NYHA class I symptoms.  Most recent echo showed a slight improvement in his LV systolic function with an EF of 45 to 50%.  He remains on Toprol-XL 12.5 mg daily.  Relative hypotension precludes escalation of GDMT at this time, and in fact may lead to the discontinuation of beta-blocker moving forward pending BP trend.  CHF education.  Persistent A-fib: He remains in A-fib with controlled ventricular response on low-dose Toprol-XL 12.5 mg daily.  CHA2DS2-VASc at least 5 (CHF, HTN, age x2, vascular disease).  He remains on apixaban 5 mg twice daily (does not meet reduced dosing criteria) without symptoms concerning for bleeding.  Recent labs showed stable renal function  Hypotension: Blood pressure is on the soft side today, though he is asymptomatic.  For now, we will continue him on low-dose Toprol-XL as outlined above.  His wife will check the patient's blood pressure over the next 1 to 2 weeks and give [place] a call with updated readings to trend.  If BP continues to be on the low side, we may need to hold Toprol-XL.  HLD: LDL 69.  He remains on atorvastatin 40 mg daily.  Continue to walk regularly.   Disposition: F/u with Dr. Okey Dupre or an APP in 6 months.   Medication Adjustments/Labs and Tests Ordered: Current medicines are reviewed at length with the patient today.  Concerns regarding medicines are outlined above. Medication changes, Labs and Tests ordered today are summarized above and listed in the Patient Instructions accessible in Encounters.   SignedEula Listen, PA-C 07/08/2022 10:45 AM     [facility] [place] [address] [place], [place] [address] [phone]

## 2022-07-08 ENCOUNTER — Encounter: Payer: Self-pay | Admitting: Physician Assistant

## 2022-07-08 ENCOUNTER — Ambulatory Visit (INDEPENDENT_AMBULATORY_CARE_PROVIDER_SITE_OTHER): Payer: PPO | Admitting: Physician Assistant

## 2022-07-08 VITALS — BP 92/54 | HR 75 | Ht 74.0 in | Wt 176.6 lb

## 2022-07-08 DIAGNOSIS — I2510 Atherosclerotic heart disease of native coronary artery without angina pectoris: Secondary | ICD-10-CM

## 2022-07-08 DIAGNOSIS — I4819 Other persistent atrial fibrillation: Secondary | ICD-10-CM

## 2022-07-08 DIAGNOSIS — E785 Hyperlipidemia, unspecified: Secondary | ICD-10-CM

## 2022-07-08 DIAGNOSIS — I10 Essential (primary) hypertension: Secondary | ICD-10-CM

## 2022-07-08 DIAGNOSIS — I255 Ischemic cardiomyopathy: Secondary | ICD-10-CM | POA: Diagnosis not present

## 2022-07-08 DIAGNOSIS — I5020 Unspecified systolic (congestive) heart failure: Secondary | ICD-10-CM

## 2022-07-08 MED ORDER — ATORVASTATIN CALCIUM 40 MG PO TABS: 40.0000 mg | ORAL_TABLET | Freq: Every day | ORAL | 3 refills | Status: DC

## 2022-07-08 NOTE — Patient Instructions (Signed)
Medication Instructions:  No changes at this time.   *If you need a refill on your cardiac medications before your next appointment, please call your pharmacy*   Lab Work: None  If you have labs (blood work) drawn today and your tests are completely normal, you will receive your results only by: MyChart Message (if you have MyChart) OR A paper copy in the mail If you have any lab test that is abnormal or we need to change your treatment, we will call you to review the results.   Testing/Procedures: None   Follow-Up: At [facility], you and your health needs are our priority.  As part of our continuing mission to provide you with exceptional heart care, we have created designated Provider Care Teams.  These Care Teams include your primary Cardiologist (physician) and Advanced Practice Providers (APPs -  Physician Assistants and Nurse Practitioners) who all work together to provide you with the care you need, when you need it.  We recommend signing up for the patient portal called "MyChart".  Sign up information is provided on this After Visit Summary.  MyChart is used to connect with patients for Virtual Visits (Telemedicine).  Patients are able to view lab/test results, encounter notes, upcoming appointments, etc.  Non-urgent messages can be sent to your provider as well.   To learn more about what you can do with MyChart, go to [email or web address].    Your next appointment:   6 month(s)  The format for your next appointment:   In Person  Provider:   Christopher End, MD or Ryan Dunn, PA-C       Important Information About Sugar       

## 2022-07-30 ENCOUNTER — Telehealth: Payer: Self-pay | Admitting: Physician Assistant

## 2022-07-30 NOTE — Telephone Encounter (Signed)
Patients wife calling in with readings that provider had requested. See readings below. Reviewed that they are within normal ranges and that I would send these over to him for review and any potential recommendations. She verbalized understanding with no further questions at this time.   Readings were:  125/67 111/67 117/67 123/73 107/72 104/64 97/62

## 2022-07-30 NOTE — Telephone Encounter (Signed)
Spouse would like for nurse to callback regarding BP reading that was requested. Please advise

## 2022-07-30 NOTE — Telephone Encounter (Signed)
Left voicemail message that I was returning her call and to call back.

## 2022-07-31 NOTE — Telephone Encounter (Signed)
Left detailed voicemail message that provider reviewed and readings are well controlled. Continue current medications and call back if any further questions.

## 2022-07-31 NOTE — Telephone Encounter (Signed)
Blood pressures are overall well controlled.  Continue current medications.

## 2022-08-10 ENCOUNTER — Other Ambulatory Visit: Payer: Self-pay | Admitting: Internal Medicine

## 2022-08-11 NOTE — Telephone Encounter (Signed)
Refill request

## 2022-08-11 NOTE — Telephone Encounter (Signed)
Prescription refill request for Eliquis received.  Indication: afib  Last office visit: Dunn, 07/08/2022 Scr: 1.5, 04/03/2022 Age: 76 yo  Weight: 80.1 kg   Refill sent.

## 2022-09-09 ENCOUNTER — Telehealth: Payer: Self-pay | Admitting: Internal Medicine

## 2022-09-09 DIAGNOSIS — I4819 Other persistent atrial fibrillation: Secondary | ICD-10-CM

## 2022-09-09 MED ORDER — APIXABAN 5 MG PO TABS: 5.0000 mg | ORAL_TABLET | Freq: Two times a day (BID) | ORAL | 1 refills | Status: AC

## 2022-09-09 NOTE — Telephone Encounter (Signed)
Eliquis 5mg  refill request received. Patient is 76 years old, weight-80.1kg, Crea-1.41 on 03/28/2022, Diagnosis-Afib, and last seen by Danny Gilmore on 07/08/2022. Dose is appropriate based on dosing criteria. Will send in refill to requested pharmacy.

## 2022-09-09 NOTE — Telephone Encounter (Signed)
*  STAT* If patient is at the pharmacy, call can be transferred to refill team.   1. Which medications need to be refilled? (please list name of each medication and dose if known) apixaban (ELIQUIS) 5 MG TABS tablet  2. Which pharmacy/location (including street and city if local pharmacy) is medication to be sent to?  [id number] - [place], [place] - [address]       3. Do they need a 30 day or 90 day supply? [place]

## 2023-02-26 ENCOUNTER — Other Ambulatory Visit
Admission: RE | Admit: 2023-02-26 | Discharge: 2023-02-26 | Disposition: A | Discharge status: Home / Self Care | Payer: PPO | Source: Ambulatory Visit | Attending: Internal Medicine | Admitting: Internal Medicine

## 2023-02-26 ENCOUNTER — Encounter: Payer: Self-pay | Admitting: Internal Medicine

## 2023-02-26 ENCOUNTER — Ambulatory Visit: Payer: PPO | Attending: Internal Medicine | Admitting: Internal Medicine

## 2023-02-26 VITALS — BP 80/60 | HR 72 | Ht 74.0 in | Wt 182.1 lb

## 2023-02-26 DIAGNOSIS — E785 Hyperlipidemia, unspecified: Secondary | ICD-10-CM | POA: Diagnosis not present

## 2023-02-26 DIAGNOSIS — I5022 Chronic systolic (congestive) heart failure: Secondary | ICD-10-CM | POA: Diagnosis not present

## 2023-02-26 DIAGNOSIS — I2510 Atherosclerotic heart disease of native coronary artery without angina pectoris: Secondary | ICD-10-CM

## 2023-02-26 DIAGNOSIS — Z79899 Other long term (current) drug therapy: Secondary | ICD-10-CM | POA: Insufficient documentation

## 2023-02-26 DIAGNOSIS — I4821 Permanent atrial fibrillation: Secondary | ICD-10-CM

## 2023-02-26 DIAGNOSIS — I959 Hypotension, unspecified: Secondary | ICD-10-CM

## 2023-02-26 LAB — COMPREHENSIVE METABOLIC PANEL
ALT: 50 U/L — ABNORMAL HIGH (ref 0–44)
AST: 59 U/L — ABNORMAL HIGH (ref 15–41)
Albumin: 3.9 g/dL (ref 3.5–5.0)
Alkaline Phosphatase: 94 U/L (ref 38–126)
Anion gap: 8 (ref 5–15)
BUN: 18 mg/dL (ref 8–23)
CO2: 23 mmol/L (ref 22–32)
Calcium: 9.2 mg/dL (ref 8.9–10.3)
Chloride: 105 mmol/L (ref 98–111)
Creatinine, Ser: 1.5 mg/dL — ABNORMAL HIGH (ref 0.61–1.24)
GFR, Estimated: 48 mL/min — ABNORMAL LOW (ref >60)
Glucose, Bld: 118 mg/dL — ABNORMAL HIGH (ref 70–99)
Potassium: 3.8 mmol/L (ref 3.5–5.1)
Sodium: 136 mmol/L (ref 135–145)
Total Bilirubin: 1.2 mg/dL (ref 0.3–1.2)
Total Protein: 7.1 g/dL (ref 6.5–8.1)

## 2023-02-26 LAB — CBC
HCT: 47.7 % (ref 39.0–52.0)
Hemoglobin: 15.3 g/dL (ref 13.0–17.0)
MCH: 29.9 pg (ref 26.0–34.0)
MCHC: 32.1 g/dL (ref 30.0–36.0)
MCV: 93.3 fL (ref 80.0–100.0)
Platelets: 220 10*3/uL (ref 150–400)
RBC: 5.11 MIL/uL (ref 4.22–5.81)
RDW: 13.5 % (ref 11.5–15.5)
WBC: 9.3 10*3/uL (ref 4.0–10.5)
nRBC: 0 % (ref 0.0–0.2)

## 2023-02-26 LAB — LIPID PANEL
Cholesterol: 116 mg/dL (ref 0–200)
HDL: 34 mg/dL — ABNORMAL LOW (ref >40)
LDL Cholesterol: 65 mg/dL (ref 0–99)
Total CHOL/HDL Ratio: 3.4 RATIO
Triglycerides: 86 mg/dL (ref <150)
VLDL: 17 mg/dL (ref 0–40)

## 2023-02-26 LAB — TSH: TSH: 2.988 u[IU]/mL (ref 0.350–4.500)

## 2023-02-26 NOTE — Progress Notes (Signed)
Follow-up Outpatient Visit Date: 02/26/2023  Primary Care Provider: Luciana Axe, NP [address] [place] [address]  Chief Complaint: Follow-up coronary artery disease, heart failure, and atrial fibrillation  HPI:  Danny Gilmore is a 77 y.o. male with history of presumed coronary artery disease with NSTEMI in 06/2019 that was managed medically due to comorbidities, persistent atrial fibrillation, chronic HFrEF, hypertension, hyperlipidemia, and questionable history of dementia, who presents for follow-up of coronary artery disease, atrial fibrillation, and heart failure.  He was last seen in our office in 06/2022 by Eula Listen, PA, at which time he was doing well from a heart standpoint.  He was walking 2 miles a day without difficulty.  He continues to smoke.  No medication changes or additional testing were pursued.  Today, Danny Gilmore is without complaints, denying chest pain, shortness of breath, palpitation, lightheadedness, edema, bleeding, and falls.  However, his wife feels like he has been slowing down.  He sleeps a lot.  He continues to drive, which concerns her given his underlying vascular dementia.  On further questioning, he does not drink much during the day, typically a cup of coffee in the morning and then a glass of tea with lunch and dinner.  He will sometimes drink a soda as well.  He does not drink water.  --------------------------------------------------------------------------------------------------  Past Medical History:  Diagnosis Date   HFrEF (heart failure with reduced ejection fraction)    a. EF 40-45%, diffuse HK w/ images suggestive of mod HK of the inf & inferolat wall, normal RVSF and cavity size, RVSP 32.8 mmHg, mod MR, small pericardial effusion   Hyperlipidemia    Hypertension    NSTEMI (non-ST elevated myocardial infarction)    Paroxysmal atrial fibrillation    a. diagnosed 06/2019; b. CHADS2VASc 4 (CHF, HTN, age x 1, vascular dz); c. Xarelto    Syncope and collapse    Tobacco use    History reviewed. No pertinent surgical history.   Current Meds  Medication Sig   apixaban (ELIQUIS) 5 MG TABS tablet Take 1 tablet (5 mg total) by mouth 2 (two) times daily.   atorvastatin (LIPITOR) 40 MG tablet Take 1 tablet (40 mg total) by mouth daily.   cetirizine (ZYRTEC) 10 MG chewable tablet Chew 10 mg by mouth daily. 1-2 5mg  nightly   cholecalciferol (VITAMIN D3) 25 MCG (1000 UNIT) tablet Take 1,000 Units by mouth daily.   Cyanocobalamin (VITAMIN B-12 PO) Take by mouth daily.   memantine (NAMENDA) 10 MG tablet Take 10 mg by mouth 2 (two) times daily.   metoprolol succinate (TOPROL-XL) 25 MG 24 hr tablet Take 0.5 tablets (12.5 mg total) by mouth daily. Take with or immediately following a meal.   vitamin C (ASCORBIC ACID) 250 MG tablet Take 250 mg by mouth daily.    Allergies: Patient has no known allergies.  Social History   Tobacco Use   Smoking status: Every Day    Packs/day: 0.50    Years: 50.00    Additional pack years: 0.00    Total pack years: 25.00    Types: Cigarettes   Smokeless tobacco: Never  Vaping Use   Vaping Use: Never used  Substance Use Topics   Alcohol use: Not Currently   Drug use: Not Currently    Family History  Problem Relation Age of Onset   Stroke Mother    Cancer Father     Review of Systems: A 12-system review of systems was performed and was negative except  as noted in the HPI.  --------------------------------------------------------------------------------------------------  Physical Exam: BP (!) 80/60 (BP Location: Right Arm, Patient Position: Sitting, Cuff Size: Normal)   Pulse 72   Ht 6\' 2"  (1.88 m)   Wt 182 lb 2 oz (82.6 kg)   SpO2 96%   BMI 23.38 kg/m   General:  NAD.  Accompanied by his wife. Neck: No JVD or HJR. Lungs: Clear to auscultation bilaterally without wheezes or crackles. Heart: Irregular irregular rhythm without murmurs. Abdomen: Soft, nontender,  nondistended. Extremities: No lower extremity edema.  EKG: Atrial fibrillation and left axis deviation.  No significant change from prior tracing on 07/08/2022.  Lab Results  Component Value Date   WBC 10.1 03/28/2022   HGB 16.7 03/28/2022   HCT 51.6 03/28/2022   MCV 93.5 03/28/2022   PLT 245 03/28/2022    Lab Results  Component Value Date   NA 139 03/28/2022   K 3.6 03/28/2022   CL 103 03/28/2022   CO2 28 03/28/2022   BUN 15 03/28/2022   CREATININE 1.41 (H) 03/28/2022   GLUCOSE 78 03/28/2022   ALT 34 03/28/2022    Lab Results  Component Value Date   CHOL 126 09/25/2021   HDL 36 (L) 09/25/2021   LDLCALC 69 09/25/2021   TRIG 117 09/25/2021   CHOLHDL 3.5 09/25/2021    --------------------------------------------------------------------------------------------------  ASSESSMENT AND PLAN: Coronary artery disease: No angina reported.  Continue statin and apixaban for secondary prevention.  Given his symptoms and dementia, defer ischemia evaluation.  Chronic HFmrEF: Danny Gilmore appears euvolemic and if anything might be slightly dry due to his soft blood pressure and poor oral intake.  His soft blood pressure necessitates discontinuation of metoprolol succinate today and precludes addition of any other GDMT.  Permanent atrial fibrillation: Ventricular rates reasonable today.  As above, we will discontinue metoprolol succinate.  Continue apixaban 5 mg twice daily.  Check CBC, TSH, CMP today.  Hyperlipidemia: Continue rosuvastatin.  Check CMP and lipid panel today.  Hypotension: Blood pressure again soft today albeit without symptoms.  I have encouraged Danny Gilmore to increase his water intake.  We will stop metoprolol succinate.  I am concerned about him continuing to drive with his soft blood pressure, fatigue, and dementia.  I advised Danny Gilmore and his wife that he should try to minimize driving.  His wife notes that he is due for DMV reassessment next month.  Follow-up:  Return to clinic in 1 month to reassess blood pressure and heart rate.  Yvonne Kendall, MD 02/26/2023 8:35 AM

## 2023-02-26 NOTE — Patient Instructions (Addendum)
Medication Instructions:  Your physician recommends the following medication changes.  STOP TAKING: Metoprolol Succinate 12.5 mg daily  *If you need a refill on your cardiac medications before your next appointment, please call your pharmacy*   Lab Work: Your provider would like for you to have following labs drawn: (CBC, CMP, Lipid, TSH).   Please go to the [facility] entrance and check in at the front desk.  You do not need an appointment.  They are open from 7am-6 pm.     Testing/Procedures: None ordered today   Follow-Up: At [facility], you and your health needs are our priority.  As part of our continuing mission to provide you with exceptional heart care, we have created designated Provider Care Teams.  These Care Teams include your primary Cardiologist (physician) and Advanced Practice Providers (APPs -  Physician Assistants and Nurse Practitioners) who all work together to provide you with the care you need, when you need it.  We recommend signing up for the patient portal called "MyChart".  Sign up information is provided on this After Visit Summary.  MyChart is used to connect with patients for Virtual Visits (Telemedicine).  Patients are able to view lab/test results, encounter notes, upcoming appointments, etc.  Non-urgent messages can be sent to your provider as well.   To learn more about what you can do with MyChart, go to [email or web address].    Your next appointment:   1 month(s)  Provider:   You may see Yvonne Kendall, MD or one of the following Advanced Practice Providers on your designated Care Team:   Eula Listen, [place]  Dr. Okey Dupre recommends increasing fluid intake daily.

## 2023-03-04 ENCOUNTER — Other Ambulatory Visit: Payer: Self-pay

## 2023-03-04 DIAGNOSIS — Z79899 Other long term (current) drug therapy: Secondary | ICD-10-CM

## 2023-03-17 ENCOUNTER — Other Ambulatory Visit: Payer: Self-pay | Admitting: Internal Medicine

## 2023-03-26 NOTE — Progress Notes (Signed)
Cardiology Office Note    Date:  03/30/2023   ID:  Danny Gilmore, DOB 1946-08-18, MRN [id number]  PCP:  Luciana Axe, NP  Cardiologist:  Yvonne Kendall, MD  Electrophysiologist:  None   Chief Complaint: Follow-up  History of Present Illness:   Danny Gilmore is a 77 y.o. male with history of CAD with NSTEMI medically managed due to comorbidities, persistent Afib diagnosed in 06/2019, HFrEF, vascular dementia, hyperglycemia, syncope, CKD stage IIIa, HTN, HLD, and tobacco abuse who presents for follow-up of his CAD and cardiomyopathy.   He was admitted to [facility] in 06/2019 following a syncopal episode at [facility]. Upon his arrival to the hospital, he was noted to be in Afib with RVR. Initial high-sensitivity troponin of 4318 with a delta of 3479. CT head was negative.  Echo showed an EF of 40-45%, diffuse hypokinesis with images suggestive of moderate hypokinesis of the inferior and inferolateral wall, normal RVSF and cavity size, RVSP 32.8 mmHg, moderate MR, small pericardial effusion. Cardiac cath was deferred after discussion with the patient given significant comorbidities.  Following his discharge, he was diagnosed with vascular dementia at outside office.  He was seen in the office in 03/2022 and was without symptoms of angina or decompensation.  He was ambulating 2 miles per day without difficulty.  He remained in A-fib with reasonably controlled ventricular response.  Toprol 12.5 mg daily was added.  He underwent echo in 04/2022 which demonstrated an EF of 45 to 50%, inferior wall hypokinesis, possibly extending into part of the lateral wall, indeterminate LV diastolic function parameters, normal RV systolic function and ventricular cavity size, mildly dilated left atrium, mild mitral regurgitation, moderate mitral annular calcification, and an estimated right atrial pressure of 3 mmHg.  He was last seen in the office in 02/2023 and was without symptoms of angina or cardiac decompensation.   However, his wife felt like he had been slowing down with increased somnolence.  She had concerns regarding him continuing to drive with underlying vascular dementia.  Upon further questioning, it was discovered he did not drink much during the day, typically a cup of coffee in the morning and then a glass of tea with lunch and dinner with an occasional soda.  He did not drink water.  BP was soft at 80/60.  Toprol was discontinued and it was recommended he increase water intake.  It was recommended he minimize driving.  He comes in today accompanied by his wife, who supplies the history given patient's underlying vascular dementia with minimal conversation.  She reports the patient has been without symptoms of angina or cardiac decompensation.  She indicates he has not noted dyspnea, palpitations, dizziness, presyncope, or syncope.  He has not added water to his diet, continuing to drink 1 cup of coffee in the morning, a glass of tea with lunch and dinner, and an occasional soda.  She does feel like his fatigue is a little improved off metoprolol.  She reports the DMV has issued to the patient his driver's license.   Labs independently reviewed: 02/2023 - Hgb 15.3, PLT 220 potassium 3.8, BUN 18, serum creatinine 1.5, albumin 3.9, AST 59, ALT 50, TC 116, TG 86, HDL 34, LDL 65, TSH normal 09/2022 - A1c 6.3  Past Medical History:  Diagnosis Date   HFrEF (heart failure with reduced ejection fraction) (HCC)    a. EF 40-45%, diffuse HK w/ images suggestive of mod HK of the inf & inferolat wall, normal RVSF and cavity  size, RVSP 32.8 mmHg, mod MR, small pericardial effusion   Hyperlipidemia    Hypertension    NSTEMI (non-ST elevated myocardial infarction) (HCC)    Paroxysmal atrial fibrillation (HCC)    a. diagnosed 06/2019; b. CHADS2VASc 4 (CHF, HTN, age x 1, vascular dz); c. Xarelto   Syncope and collapse    Tobacco use     History reviewed. No pertinent surgical history.  Current  Medications: Current Meds  Medication Sig   apixaban (ELIQUIS) 5 MG TABS tablet Take 1 tablet (5 mg total) by mouth 2 (two) times daily.   atorvastatin (LIPITOR) 40 MG tablet Take 1 tablet (40 mg total) by mouth daily.   cetirizine (ZYRTEC) 10 MG chewable tablet Chew 10 mg by mouth daily. 1-2 5mg  nightly   cholecalciferol (VITAMIN D3) 25 MCG (1000 UNIT) tablet Take 1,000 Units by mouth daily.   Cyanocobalamin (VITAMIN B-12 PO) Take by mouth daily.   memantine (NAMENDA) 10 MG tablet Take 10 mg by mouth 2 (two) times daily.   vitamin C (ASCORBIC ACID) 250 MG tablet Take 250 mg by mouth daily.    Allergies:   Patient has no known allergies.   Social History   Socioeconomic History   Marital status: Married    Spouse name: Not on file   Number of children: Not on file   Years of education: Not on file   Highest education level: Not on file  Occupational History   Not on file  Tobacco Use   Smoking status: Every Day    Packs/day: 0.50    Years: 50.00    Additional pack years: 0.00    Total pack years: 25.00    Types: Cigarettes   Smokeless tobacco: Never  Vaping Use   Vaping Use: Never used  Substance and Sexual Activity   Alcohol use: Not Currently   Drug use: Not Currently   Sexual activity: Not on file  Other Topics Concern   Not on file  Social History Narrative   Not on file   Social Determinants of Health   Financial Resource Strain: Not on file  Food Insecurity: Not on file  Transportation Needs: Not on file  Physical Activity: Not on file  Stress: Not on file  Social Connections: Not on file     Family History:  The patient's family history includes Cancer in his father; Stroke in his mother.  ROS:   12-point review of systems is negative unless otherwise noted in the HPI.   EKGs/Labs/Other Studies Reviewed:    Studies reviewed were summarized above. The additional studies were reviewed today:  2D echo 04/29/2022: 1. Left ventricular ejection  fraction, by estimation, is 45 to 50%. The  left ventricle has mildly decreased function. The left ventricle  demonstrates regional wall motion abnormalities (Inferior wall  hypokinesis, possibly extending into part of the  lateral wall). Left ventricular diastolic parameters are indeterminate.   2. Right ventricular systolic function is normal. The right ventricular  size is normal. Tricuspid regurgitation signal is inadequate for assessing  PA pressure.   3. Left atrial size was mildly dilated.   4. The mitral valve is normal in structure. Mild mitral valve  regurgitation,two jets. No evidence of mitral stenosis. Moderate mitral  annular calcification.   5. The aortic valve is normal in structure. Aortic valve regurgitation is  not visualized. No aortic stenosis is present.   6. The inferior vena cava is normal in size with greater than 50%  respiratory variability, suggesting right  atrial pressure of 3 mmHg.   Comparison(s): 06/2019-EF 40-45%; Moderate MVR. __________   2D echo 07/13/2019: 1. The left ventricle has mild-moderately reduced systolic function, with  an ejection fraction of 40-45%. The cavity size was mildly dilated. Left  ventricular with mild diffuse hypokinesis, images suggestive of moderate  hypokinesis of the inferior and  inferolateral wall.   2. The right ventricle has normal systolic function. The cavity was  normal. There is no increase in right ventricular wall thickness. Right  ventricular systolic pressure is mildly elevated with an estimated  pressure of 32.8 mmHg.   3. Mitral valve regurgitation is moderate   4. Small pericardial effusion.   EKG:  EKG is ordered today.  The EKG ordered today demonstrates A-fib, 79 bpm, left axis deviation, low voltage, poor R wave progression along the precordial leads, nonspecific ST-T changes, consistent with prior tracing  Recent Labs: 02/26/2023: ALT 50; BUN 18; Creatinine, Ser 1.50; Hemoglobin 15.3; Platelets 220;  Potassium 3.8; Sodium 136; TSH 2.988  Recent Lipid Panel    Component Value Date/Time   CHOL 116 02/26/2023 0919   CHOL 126 09/25/2021 1033   TRIG 86 02/26/2023 0919   HDL 34 (L) 02/26/2023 0919   HDL 36 (L) 09/25/2021 1033   CHOLHDL 3.4 02/26/2023 0919   VLDL 17 02/26/2023 0919   LDLCALC 65 02/26/2023 0919   LDLCALC 69 09/25/2021 1033    PHYSICAL EXAM:    VS:  BP 96/68 (BP Location: Left Arm, Patient Position: Sitting, Cuff Size: Normal)   Pulse 79   Ht 6\' 2"  (1.88 m)   Wt 183 lb 3.2 oz (83.1 kg)   SpO2 95%   BMI 23.52 kg/m   BMI: Body mass index is 23.52 kg/m.  Physical Exam Vitals reviewed.  Constitutional:      Appearance: He is well-developed.  HENT:     Head: Normocephalic and atraumatic.  Eyes:     General:        Right eye: No discharge.        Left eye: No discharge.  Neck:     Vascular: No JVD.  Cardiovascular:     Rate and Rhythm: Normal rate. Rhythm irregularly irregular.     Heart sounds: Normal heart sounds, S1 normal and S2 normal. Heart sounds not distant. No midsystolic click and no opening snap. No murmur heard.    No friction rub.  Pulmonary:     Effort: Pulmonary effort is normal. No respiratory distress.     Breath sounds: Normal breath sounds. No decreased breath sounds, wheezing or rales.  Chest:     Chest wall: No tenderness.  Abdominal:     General: There is no distension.  Musculoskeletal:     Cervical back: Normal range of motion.     Right lower leg: No edema.     Left lower leg: No edema.  Skin:    General: Skin is warm and dry.     Nails: There is no clubbing.  Neurological:     Mental Status: He is alert and oriented to person, place, and time.  Psychiatric:        Speech: Speech normal.        Behavior: Behavior normal.        Thought Content: Thought content normal.        Judgment: Judgment normal.     Wt Readings from Last 3 Encounters:  03/30/23 183 lb 3.2 oz (83.1 kg)  02/26/23 182 lb 2 oz (82.6 kg)  07/08/22  176 lb 9.6 oz (80.1 kg)     ASSESSMENT & PLAN:   CAD involving native coronary arteries without angina: He is without symptoms of angina.  Continue apixaban in place of aspirin given underlying permanent A-fib along with atorvastatin.  Given underlying dementia, no plans for further ischemic evaluation at this time.  HFmrEF: Euvolemic to possibly volume depleted.  No longer on a beta-blocker secondary to hypotension.  Not requiring a standing loop diuretic.  Hypotension precludes addition of any other GDMT.  Permanent A-fib: Well-controlled ventricular rates, not on AV nodal blocking medication.  CHA2DS2-VASc at least 4 (HTN, age x 2, vascular disease).  He remains on apixaban 5 mg twice daily and does not currently meet reduced dosing criteria.  No symptoms concerning for bleeding.  No falls.  Recent Hgb stable.  Trend renal function and electrolytes today.  Hypotension: Improved off metoprolol.  Again encouraged increased water intake.  Would try and minimize driving.  HLD: LDL 65 in 02/2023 with mildly elevated AST/ALT at that time.  He remains on atorvastatin 40 mg.  CKD stage IIIa: Check BMP.  Elevated LFTs: Check liver function.    Disposition: F/u with Dr. Okey Dupre or an APP in 3 months.   Medication Adjustments/Labs and Tests Ordered: Current medicines are reviewed at length with the patient today.  Concerns regarding medicines are outlined above. Medication changes, Labs and Tests ordered today are summarized above and listed in the Patient Instructions accessible in Encounters.   Signed, Eula Listen, PA-C 03/30/2023 11:17 AM     [facility] [place] [address] [place], [place] [address] [phone]

## 2023-03-30 ENCOUNTER — Encounter: Payer: Self-pay | Admitting: Physician Assistant

## 2023-03-30 ENCOUNTER — Ambulatory Visit: Payer: PPO | Attending: Physician Assistant | Admitting: Physician Assistant

## 2023-03-30 ENCOUNTER — Other Ambulatory Visit
Admission: RE | Admit: 2023-03-30 | Discharge: 2023-03-30 | Disposition: A | Discharge status: Home / Self Care | Payer: PPO | Source: Ambulatory Visit | Attending: Physician Assistant | Admitting: Physician Assistant

## 2023-03-30 VITALS — BP 96/68 | HR 79 | Ht 74.0 in | Wt 183.2 lb

## 2023-03-30 DIAGNOSIS — I959 Hypotension, unspecified: Secondary | ICD-10-CM

## 2023-03-30 DIAGNOSIS — I5022 Chronic systolic (congestive) heart failure: Secondary | ICD-10-CM | POA: Diagnosis present

## 2023-03-30 DIAGNOSIS — I4821 Permanent atrial fibrillation: Secondary | ICD-10-CM | POA: Insufficient documentation

## 2023-03-30 DIAGNOSIS — E785 Hyperlipidemia, unspecified: Secondary | ICD-10-CM

## 2023-03-30 DIAGNOSIS — I2510 Atherosclerotic heart disease of native coronary artery without angina pectoris: Secondary | ICD-10-CM | POA: Diagnosis present

## 2023-03-30 DIAGNOSIS — R7989 Other specified abnormal findings of blood chemistry: Secondary | ICD-10-CM

## 2023-03-30 DIAGNOSIS — N1831 Chronic kidney disease, stage 3a: Secondary | ICD-10-CM

## 2023-03-30 LAB — COMPREHENSIVE METABOLIC PANEL
ALT: 38 U/L (ref 0–44)
AST: 44 U/L — ABNORMAL HIGH (ref 15–41)
Albumin: 4.2 g/dL (ref 3.5–5.0)
Alkaline Phosphatase: 108 U/L (ref 38–126)
Anion gap: 10 (ref 5–15)
BUN: 16 mg/dL (ref 8–23)
CO2: 28 mmol/L (ref 22–32)
Calcium: 9.3 mg/dL (ref 8.9–10.3)
Chloride: 104 mmol/L (ref 98–111)
Creatinine, Ser: 1.28 mg/dL — ABNORMAL HIGH (ref 0.61–1.24)
GFR, Estimated: 58 mL/min — ABNORMAL LOW (ref >60)
Glucose, Bld: 83 mg/dL (ref 70–99)
Potassium: 3.6 mmol/L (ref 3.5–5.1)
Sodium: 142 mmol/L (ref 135–145)
Total Bilirubin: 1.3 mg/dL — ABNORMAL HIGH (ref 0.3–1.2)
Total Protein: 7.1 g/dL (ref 6.5–8.1)

## 2023-03-30 NOTE — Patient Instructions (Signed)
Medication Instructions:  Your physician recommends that you continue on your current medications as directed. Please refer to the Current Medication list given to you today.  *If you need a refill on your cardiac medications before your next appointment, please call your pharmacy*   Lab Work: Your physician recommends that you get lab work today: Art therapist at [facility] 1st desk on the right to check in (REGISTRATION)  Lab hours: Monday- Friday (7:30 am- 5:30 pm)   If you have labs (blood work) drawn today and your tests are completely normal, you will receive your results only by: MyChart Message (if you have MyChart) OR A paper copy in the mail If you have any lab test that is abnormal or we need to change your treatment, we will call you to review the results.   Testing/Procedures: -None ordered   Follow-Up: At [facility], you and your health needs are our priority.  As part of our continuing mission to provide you with exceptional heart care, we have created designated Provider Care Teams.  These Care Teams include your primary Cardiologist (physician) and Advanced Practice Providers (APPs -  Physician Assistants and Nurse Practitioners) who all work together to provide you with the care you need, when you need it.  We recommend signing up for the patient portal called "MyChart".  Sign up information is provided on this After Visit Summary.  MyChart is used to connect with patients for Virtual Visits (Telemedicine).  Patients are able to view lab/test results, encounter notes, upcoming appointments, etc.  Non-urgent messages can be sent to your provider as well.   To learn more about what you can do with MyChart, go to [email or web address].    Your next appointment:   3 month(s)  Provider:   You may see Yvonne Kendall, MD or one of the following Advanced Practice Providers on your designated Care Team:   Nicolasa Ducking, NP Eula Listen, PA-C [place]  Fransico Michael, PA-C Charlsie Quest, NP    Other Instructions -None

## 2023-05-12 ENCOUNTER — Telehealth: Payer: Self-pay

## 2023-05-12 NOTE — Telephone Encounter (Signed)
Working in the refill department and received a call from the wife concerned stating per the patient that Alycia Rossetti said the patient could stop Eliquis.   Wife states she is not sure that is accurate and wants to confirm.   Advised that I would send this message over to the [facility] for clarification.

## 2023-05-12 NOTE — Telephone Encounter (Signed)
Spoke with patients wife and she states that patient is convinced he was told to stop Eliquis. Reviewed that he should continue that medication. Wife states she would pass this onto the patient but states that he is confused and going down hill. Expressed the importance of taking that medication and she verbalized understanding with no further questions.

## 2023-05-12 NOTE — Telephone Encounter (Signed)
I did not indicate the patient could discontinue apixaban.  Note indicates patient should continue current dose of Eliquis.

## 2023-06-29 NOTE — Progress Notes (Unsigned)
Cardiology Office Note    Date:  06/30/2023   ID:  Danny Gilmore, DOB 02/03/1946, MRN [id number]  PCP:  Luciana Axe, NP  Cardiologist:  Yvonne Kendall, MD  Electrophysiologist:  None   Chief Complaint: Follow up  History of Present Illness:   Danny Gilmore is a 77 y.o. male with history of CAD with NSTEMI medically managed due to comorbidities, persistent Afib diagnosed in 06/2019, HFrEF, vascular dementia, hyperglycemia, syncope, CKD stage IIIa, HTN, HLD, and tobacco abuse who presents for follow-up of CAD and A-fib.  He was admitted to [facility] in 06/2019 following a syncopal episode at [facility] Upon his arrival to the hospital, he was noted to be in Afib with RVR. Initial high-sensitivity troponin of 4318 with a delta of 3479. CT head was negative.  Echo showed an EF of 40-45%, diffuse hypokinesis with images suggestive of moderate hypokinesis of the inferior and inferolateral wall, normal RVSF and cavity size, RVSP 32.8 mmHg, moderate MR, small pericardial effusion. Cardiac cath was deferred after discussion with the patient given significant comorbidities.  Following his discharge, he was diagnosed with vascular dementia at outside office.  He underwent echo in 04/2022 which demonstrated an EF of 45 to 50%, inferior wall hypokinesis, possibly extending into part of the lateral wall, indeterminate LV diastolic function parameters, normal RV systolic function and ventricular cavity size, mildly dilated left atrium, mild mitral regurgitation, moderate mitral annular calcification, and an estimated right atrial pressure of 3 mmHg.  He was seen in the office in 02/2023 and was without symptoms of angina or cardiac decompensation.  However, his wife felt like he had been slowing down with increased somnolence.  She had concerns regarding him continuing to drive with underlying vascular dementia.  Upon further questioning, it was discovered he did not drink much during the day, typically a cup of  coffee in the morning and then a glass of tea with lunch and dinner with an occasional soda.  He did not drink water.  BP was soft at 80/60.  Toprol was discontinued and it was recommended he increase water intake.  It was recommended he minimize driving.  He was last seen in the office in 03/2023 and was without symptoms of angina or cardiac decompensation.  His wife felt like his fatigue had improved off metoprolol.  No changes in medications were recommended at that time.  He comes in accompanied by his son today who supplies most of the history given the patient's underlying vascular dementia with minimal conversation.  Both patient and son reported the patient has been without symptoms of angina or cardiac decompensation.  The patient's son indicates the patient has not voiced any cardiac complaints since his last visit.  The patient denies dyspnea, palpitations, dizziness, presyncope, or syncope.  Patient denies any falls.  Patient's son is unaware of any falls.  Patient and son are unaware of any bleeding concerns.  The patient's wife has to continually remind the patient to drink liquids.  They do not have any acute cardiac concerns at this time.   Labs independently reviewed: 03/2023 - potassium 3.9, BUN 14, serum creatinine 1.3, albumin 4.2, AST 44, ALT normal 02/2023 - Hgb 15.3, PLT 220, TC 116, TG 86, HDL 34, LDL 65, TSH normal 09/2022 - A1c 6.3  Past Medical History:  Diagnosis Date   HFrEF (heart failure with reduced ejection fraction) (HCC)    a. EF 40-45%, diffuse HK w/ images suggestive of mod HK of the inf &  inferolat wall, normal RVSF and cavity size, RVSP 32.8 mmHg, mod MR, small pericardial effusion   Hyperlipidemia    Hypertension    NSTEMI (non-ST elevated myocardial infarction) (HCC)    Paroxysmal atrial fibrillation (HCC)    a. diagnosed 06/2019; b. CHADS2VASc 4 (CHF, HTN, age x 1, vascular dz); c. Xarelto   Syncope and collapse    Tobacco use     History reviewed. No  pertinent surgical history.  Current Medications: Current Meds  Medication Sig   apixaban (ELIQUIS) 5 MG TABS tablet Take 1 tablet (5 mg total) by mouth 2 (two) times daily.   atorvastatin (LIPITOR) 40 MG tablet Take 1 tablet (40 mg total) by mouth daily.   cetirizine (ZYRTEC) 10 MG chewable tablet Chew 10 mg by mouth daily. 1-2 5mg  nightly   cholecalciferol (VITAMIN D3) 25 MCG (1000 UNIT) tablet Take 1,000 Units by mouth daily.   Cyanocobalamin (VITAMIN B-12 PO) Take by mouth daily.   memantine (NAMENDA) 10 MG tablet Take 10 mg by mouth 2 (two) times daily.   vitamin C (ASCORBIC ACID) 250 MG tablet Take 250 mg by mouth daily.    Allergies:   Patient has no known allergies.   Social History   Socioeconomic History   Marital status: Married    Spouse name: Not on file   Number of children: Not on file   Years of education: Not on file   Highest education level: Not on file  Occupational History   Not on file  Tobacco Use   Smoking status: Every Day    Current packs/day: 0.50    Average packs/day: 0.5 packs/day for 50.0 years (25.0 ttl pk-yrs)    Types: Cigarettes   Smokeless tobacco: Never  Vaping Use   Vaping status: Never Used  Substance and Sexual Activity   Alcohol use: Not Currently   Drug use: Not Currently   Sexual activity: Not on file  Other Topics Concern   Not on file  Social History Narrative   Not on file   Social Determinants of Health   Financial Resource Strain: Not on file  Food Insecurity: Not on file  Transportation Needs: Not on file  Physical Activity: Not on file  Stress: Not on file  Social Connections: Not on file     Family History:  The patient's family history includes Cancer in his father; Stroke in his mother.  ROS:   12-point review of systems is negative unless otherwise noted in the HPI.   EKGs/Labs/Other Studies Reviewed:    Studies reviewed were summarized above. The additional studies were reviewed today:  2D echo  04/29/2022: 1. Left ventricular ejection fraction, by estimation, is 45 to 50%. The  left ventricle has mildly decreased function. The left ventricle  demonstrates regional wall motion abnormalities (Inferior wall  hypokinesis, possibly extending into part of the  lateral wall). Left ventricular diastolic parameters are indeterminate.   2. Right ventricular systolic function is normal. The right ventricular  size is normal. Tricuspid regurgitation signal is inadequate for assessing  PA pressure.   3. Left atrial size was mildly dilated.   4. The mitral valve is normal in structure. Mild mitral valve  regurgitation,two jets. No evidence of mitral stenosis. Moderate mitral  annular calcification.   5. The aortic valve is normal in structure. Aortic valve regurgitation is  not visualized. No aortic stenosis is present.   6. The inferior vena cava is normal in size with greater than 50%  respiratory variability, suggesting  right atrial pressure of 3 mmHg.   Comparison(s): 06/2019-EF 40-45%; Moderate MVR. __________   2D echo 07/13/2019: 1. The left ventricle has mild-moderately reduced systolic function, with  an ejection fraction of 40-45%. The cavity size was mildly dilated. Left  ventricular with mild diffuse hypokinesis, images suggestive of moderate  hypokinesis of the inferior and  inferolateral wall.   2. The right ventricle has normal systolic function. The cavity was  normal. There is no increase in right ventricular wall thickness. Right  ventricular systolic pressure is mildly elevated with an estimated  pressure of 32.8 mmHg.   3. Mitral valve regurgitation is moderate   4. Small pericardial effusion.   EKG:  EKG is ordered today.  The EKG ordered today demonstrates Afib, 84 bpm, left axis deviation, low voltage QRS, baseline artifact, nonspecific st/t changes, consistent with prior tracing  Recent Labs: 02/26/2023: Hemoglobin 15.3; Platelets 220; TSH 2.988 03/30/2023: ALT  38; BUN 16; Creatinine, Ser 1.28; Potassium 3.6; Sodium 142  Recent Lipid Panel    Component Value Date/Time   CHOL 116 02/26/2023 0919   CHOL 126 09/25/2021 1033   TRIG 86 02/26/2023 0919   HDL 34 (L) 02/26/2023 0919   HDL 36 (L) 09/25/2021 1033   CHOLHDL 3.4 02/26/2023 0919   VLDL 17 02/26/2023 0919   LDLCALC 65 02/26/2023 0919   LDLCALC 69 09/25/2021 1033    PHYSICAL EXAM:    VS:  BP 100/70 (BP Location: Left Arm, Patient Position: Sitting, Cuff Size: Normal)   Pulse 84   Ht 6\' 2"  (1.88 m)   Wt 178 lb 12.8 oz (81.1 kg)   SpO2 96%   BMI 22.96 kg/m   BMI: Body mass index is 22.96 kg/m.  Physical Exam Vitals reviewed.  Constitutional:      Appearance: He is well-developed.  HENT:     Head: Normocephalic and atraumatic.  Eyes:     General:        Right eye: No discharge.        Left eye: No discharge.  Neck:     Vascular: No JVD.  Cardiovascular:     Rate and Rhythm: Normal rate. Rhythm irregularly irregular.     Heart sounds: Normal heart sounds, S1 normal and S2 normal. Heart sounds not distant. No midsystolic click and no opening snap. No murmur heard.    No friction rub.  Pulmonary:     Effort: Pulmonary effort is normal. No respiratory distress.     Breath sounds: Normal breath sounds. No decreased breath sounds, wheezing or rales.  Chest:     Chest wall: No tenderness.  Abdominal:     General: There is no distension.  Musculoskeletal:     Cervical back: Normal range of motion.     Right lower leg: No edema.     Left lower leg: No edema.  Skin:    General: Skin is warm and dry.     Nails: There is no clubbing.  Neurological:     Mental Status: He is alert and oriented to person, place, and time.  Psychiatric:        Speech: Speech normal.        Behavior: Behavior normal.        Thought Content: Thought content normal.        Judgment: Judgment normal.     Wt Readings from Last 3 Encounters:  06/30/23 178 lb 12.8 oz (81.1 kg)  03/30/23 183 lb  3.2 oz (83.1 kg)  02/26/23  182 lb 2 oz (82.6 kg)     ASSESSMENT & PLAN:   CAD involving native coronary arteries without angina: He is without symptoms of angina or cardiac decompensation.  He remains on apixaban in place of aspirin given underlying permanent A-fib, along with atorvastatin.  Given underlying dementia, no plans for further ischemic testing at this time.  HFmrEF: Appears euvolemic to possibly volume depleted.  No longer on beta-blocker secondary to hypotension.  Not requiring a standing loop diuretic.  Hypotension precludes escalation of GDMT.  Permanent A-fib: Ventricular rate is well-controlled not on AV nodal blocking medication.  CHA2DS2-VASc at least 4 (HTN, age x 2, vascular disease).  We remains on apixaban 5 mg twice daily and does not meet reduced dosing criteria.  No symptoms concerning for bleeding.  No falls.  Recent labs stable.  Hypotension: Improved off metoprolol.  Adequate hydration discussed.   HLD: LDL 65 in 02/2023 with mildly elevated AST/ALT at that time.  He remains on Lipitor.   CKD stage IIIa: Stable on most recent check.   Elevated LFTs: Improved on most recent check with normal ALT.   Disposition: F/u with Dr. Okey Dupre or an APP in 6 months.   Medication Adjustments/Labs and Tests Ordered: Current medicines are reviewed at length with the patient today.  Concerns regarding medicines are outlined above. Medication changes, Labs and Tests ordered today are summarized above and listed in the Patient Instructions accessible in Encounters.   Signed, Eula Listen, PA-C 06/30/2023 11:45 AM     [facility] [place] [address] [place], [place] [address] [phone]

## 2023-06-30 ENCOUNTER — Encounter: Payer: Self-pay | Admitting: Physician Assistant

## 2023-06-30 ENCOUNTER — Ambulatory Visit: Payer: PPO | Attending: Physician Assistant | Admitting: Physician Assistant

## 2023-06-30 VITALS — BP 100/70 | HR 84 | Ht 74.0 in | Wt 178.8 lb

## 2023-06-30 DIAGNOSIS — E785 Hyperlipidemia, unspecified: Secondary | ICD-10-CM

## 2023-06-30 DIAGNOSIS — I959 Hypotension, unspecified: Secondary | ICD-10-CM

## 2023-06-30 DIAGNOSIS — I4821 Permanent atrial fibrillation: Secondary | ICD-10-CM | POA: Diagnosis not present

## 2023-06-30 DIAGNOSIS — I2510 Atherosclerotic heart disease of native coronary artery without angina pectoris: Secondary | ICD-10-CM | POA: Diagnosis not present

## 2023-06-30 DIAGNOSIS — I5020 Unspecified systolic (congestive) heart failure: Secondary | ICD-10-CM

## 2023-06-30 DIAGNOSIS — R7989 Other specified abnormal findings of blood chemistry: Secondary | ICD-10-CM

## 2023-06-30 DIAGNOSIS — I5022 Chronic systolic (congestive) heart failure: Secondary | ICD-10-CM

## 2023-06-30 DIAGNOSIS — N1831 Chronic kidney disease, stage 3a: Secondary | ICD-10-CM

## 2023-06-30 NOTE — Patient Instructions (Signed)
Medication Instructions:  Your physician recommends that you continue on your current medications as directed. Please refer to the Current Medication list given to you today.  *If you need a refill on your cardiac medications before your next appointment, please call your pharmacy*   Lab Work: none If you have labs (blood work) drawn today and your tests are completely normal, you will receive your results only by: MyChart Message (if you have MyChart) OR A paper copy in the mail If you have any lab test that is abnormal or we need to change your treatment, we will call you to review the results.   Testing/Procedures: none   Follow-Up: At [facility], you and your health needs are our priority.  As part of our continuing mission to provide you with exceptional heart care, we have created designated Provider Care Teams.  These Care Teams include your primary Cardiologist (physician) and Advanced Practice Providers (APPs -  Physician Assistants and Nurse Practitioners) who all work together to provide you with the care you need, when you need it.  We recommend signing up for the patient portal called "MyChart".  Sign up information is provided on this After Visit Summary.  MyChart is used to connect with patients for Virtual Visits (Telemedicine).  Patients are able to view lab/test results, encounter notes, upcoming appointments, etc.  Non-urgent messages can be sent to your provider as well.   To learn more about what you can do with MyChart, go to [email or web address].    Your next appointment:   6 month(s)  Provider:   You may see Yvonne Kendall, MD or one of the following Advanced Practice Providers on your designated Care Team:    Eula Listen, [place]

## 2023-07-26 ENCOUNTER — Other Ambulatory Visit: Payer: Self-pay | Admitting: Physician Assistant

## 2024-03-29 DIAGNOSIS — I480 Paroxysmal atrial fibrillation: Secondary | ICD-10-CM | POA: Diagnosis not present

## 2024-03-29 DIAGNOSIS — L821 Other seborrheic keratosis: Secondary | ICD-10-CM | POA: Diagnosis not present

## 2024-03-29 DIAGNOSIS — E785 Hyperlipidemia, unspecified: Secondary | ICD-10-CM | POA: Diagnosis not present

## 2024-03-29 DIAGNOSIS — N1831 Chronic kidney disease, stage 3a: Secondary | ICD-10-CM | POA: Diagnosis not present

## 2024-03-29 DIAGNOSIS — E119 Type 2 diabetes mellitus without complications: Secondary | ICD-10-CM | POA: Diagnosis not present

## 2024-03-29 DIAGNOSIS — E538 Deficiency of other specified B group vitamins: Secondary | ICD-10-CM | POA: Diagnosis not present

## 2024-03-29 DIAGNOSIS — F0150 Vascular dementia without behavioral disturbance: Secondary | ICD-10-CM | POA: Diagnosis not present

## 2024-03-29 DIAGNOSIS — I959 Hypotension, unspecified: Secondary | ICD-10-CM | POA: Diagnosis not present

## 2024-03-29 DIAGNOSIS — E559 Vitamin D deficiency, unspecified: Secondary | ICD-10-CM | POA: Diagnosis not present

## 2024-03-29 DIAGNOSIS — Z09 Encounter for follow-up examination after completed treatment for conditions other than malignant neoplasm: Secondary | ICD-10-CM | POA: Diagnosis not present

## 2024-03-29 DIAGNOSIS — I5022 Chronic systolic (congestive) heart failure: Secondary | ICD-10-CM | POA: Diagnosis not present

## 2024-03-29 DIAGNOSIS — L989 Disorder of the skin and subcutaneous tissue, unspecified: Secondary | ICD-10-CM | POA: Diagnosis not present
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
## 2024-04-19 ENCOUNTER — Encounter: Payer: Self-pay | Admitting: Cardiology

## 2024-04-19 ENCOUNTER — Ambulatory Visit: Attending: Cardiology | Admitting: Cardiology

## 2024-04-19 VITALS — BP 90/60 | HR 82 | Ht 74.0 in | Wt 179.1 lb

## 2024-04-19 DIAGNOSIS — I2510 Atherosclerotic heart disease of native coronary artery without angina pectoris: Secondary | ICD-10-CM

## 2024-04-19 DIAGNOSIS — I4821 Permanent atrial fibrillation: Secondary | ICD-10-CM

## 2024-04-19 DIAGNOSIS — N1831 Chronic kidney disease, stage 3a: Secondary | ICD-10-CM | POA: Diagnosis not present

## 2024-04-19 DIAGNOSIS — F0150 Vascular dementia without behavioral disturbance: Secondary | ICD-10-CM

## 2024-04-19 DIAGNOSIS — I5022 Chronic systolic (congestive) heart failure: Secondary | ICD-10-CM

## 2024-04-19 DIAGNOSIS — Z720 Tobacco use: Secondary | ICD-10-CM

## 2024-04-19 DIAGNOSIS — I959 Hypotension, unspecified: Secondary | ICD-10-CM | POA: Diagnosis not present

## 2024-04-19 DIAGNOSIS — E785 Hyperlipidemia, unspecified: Secondary | ICD-10-CM | POA: Diagnosis not present

## 2024-04-19 NOTE — Patient Instructions (Signed)
 Medication Instructions:  Your Physician recommend you continue on your current medication as directed.    *If you need a refill on your cardiac medications before your next appointment, please call your pharmacy*  Lab Work: No labs ordered today  If you have labs (blood work) drawn today and your tests are completely normal, you will receive your results only by: MyChart Message (if you have MyChart) OR A paper copy in the mail If you have any lab test that is abnormal or we need to change your treatment, we will call you to review the results.  Testing/Procedures: No test ordered today   Follow-Up: At [facility], you and your health needs are our priority.  As part of our continuing mission to provide you with exceptional heart care, our providers are all part of one team.  This team includes your primary Cardiologist (physician) and Advanced Practice Providers or APPs (Physician Assistants and Nurse Practitioners) who all work together to provide you with the care you need, when you need it.  Your next appointment:   6 month(s)  Provider:   Sammy Crisp, MD, Varney Gentleman, PA-C, or Ronald Cockayne, NP     We recommend signing up for the patient portal called "MyChart".  Sign up information is provided on this After Visit Summary.  MyChart is used to connect with patients for Virtual Visits (Telemedicine).  Patients are able to view lab/test results, encounter notes, upcoming appointments, etc.  Non-urgent messages can be sent to your provider as well.   To learn more about what you can do with MyChart, go to [email or web address].   Other Instructions

## 2024-09-20 DIAGNOSIS — L570 Actinic keratosis: Secondary | ICD-10-CM | POA: Diagnosis not present

## 2024-09-20 DIAGNOSIS — L821 Other seborrheic keratosis: Secondary | ICD-10-CM | POA: Diagnosis not present

## 2024-09-20 DIAGNOSIS — D2262 Melanocytic nevi of left upper limb, including shoulder: Secondary | ICD-10-CM | POA: Diagnosis not present

## 2024-09-20 DIAGNOSIS — D2261 Melanocytic nevi of right upper limb, including shoulder: Secondary | ICD-10-CM | POA: Diagnosis not present

## 2024-09-20 DIAGNOSIS — D225 Melanocytic nevi of trunk: Secondary | ICD-10-CM | POA: Diagnosis not present

## 2024-09-20 DIAGNOSIS — D485 Neoplasm of uncertain behavior of skin: Secondary | ICD-10-CM | POA: Diagnosis not present

## 2024-09-20 DIAGNOSIS — D0422 Carcinoma in situ of skin of left ear and external auricular canal: Secondary | ICD-10-CM | POA: Diagnosis not present

## 2024-09-23 ENCOUNTER — Other Ambulatory Visit: Payer: Self-pay | Admitting: Physician Assistant

## 2024-09-26 MED ORDER — ATORVASTATIN CALCIUM 40 MG PO TABS: 40.0000 mg | ORAL_TABLET | Freq: Every day | ORAL | 2 refills | Status: AC

## 2024-10-10 DIAGNOSIS — I480 Paroxysmal atrial fibrillation: Secondary | ICD-10-CM | POA: Diagnosis not present

## 2024-10-10 DIAGNOSIS — Z23 Encounter for immunization: Secondary | ICD-10-CM | POA: Diagnosis not present

## 2024-10-10 DIAGNOSIS — E559 Vitamin D deficiency, unspecified: Secondary | ICD-10-CM | POA: Diagnosis not present

## 2024-10-10 DIAGNOSIS — F0150 Vascular dementia without behavioral disturbance: Secondary | ICD-10-CM | POA: Diagnosis not present

## 2024-10-10 DIAGNOSIS — I959 Hypotension, unspecified: Secondary | ICD-10-CM | POA: Diagnosis not present

## 2024-10-10 DIAGNOSIS — E785 Hyperlipidemia, unspecified: Secondary | ICD-10-CM | POA: Diagnosis not present

## 2024-10-10 DIAGNOSIS — I252 Old myocardial infarction: Secondary | ICD-10-CM | POA: Diagnosis not present

## 2024-10-10 DIAGNOSIS — E538 Deficiency of other specified B group vitamins: Secondary | ICD-10-CM | POA: Diagnosis not present

## 2024-10-10 DIAGNOSIS — I5022 Chronic systolic (congestive) heart failure: Secondary | ICD-10-CM | POA: Diagnosis not present

## 2024-10-10 DIAGNOSIS — I2510 Atherosclerotic heart disease of native coronary artery without angina pectoris: Secondary | ICD-10-CM | POA: Diagnosis not present

## 2024-10-10 DIAGNOSIS — Z720 Tobacco use: Secondary | ICD-10-CM | POA: Diagnosis not present

## 2024-10-10 DIAGNOSIS — Z1331 Encounter for screening for depression: Secondary | ICD-10-CM | POA: Diagnosis not present

## 2024-10-10 DIAGNOSIS — E119 Type 2 diabetes mellitus without complications: Secondary | ICD-10-CM | POA: Diagnosis not present

## 2024-10-10 DIAGNOSIS — N1831 Chronic kidney disease, stage 3a: Secondary | ICD-10-CM | POA: Diagnosis not present

## 2024-10-10 DIAGNOSIS — Z0000 Encounter for general adult medical examination without abnormal findings: Secondary | ICD-10-CM | POA: Diagnosis not present

## 2024-12-19 ENCOUNTER — Ambulatory Visit: Admitting: Cardiology
